# Patient Record
Sex: Male | Born: 1970 | Race: Black or African American | Hispanic: No | Marital: Married | Smoking: Never smoker
Health system: Southern US, Community
[De-identification: ages and names within clinical notes are randomized; demographics above are authoritative.]

## PROBLEM LIST (undated history)

## (undated) DIAGNOSIS — Z202 Contact with and (suspected) exposure to infections with a predominantly sexual mode of transmission: Secondary | ICD-10-CM

## (undated) DIAGNOSIS — E785 Hyperlipidemia, unspecified: Secondary | ICD-10-CM

## (undated) HISTORY — DX: Hyperlipidemia, unspecified: E78.5

## (undated) HISTORY — PX: WISDOM TOOTH EXTRACTION: SHX21

## (undated) HISTORY — PX: CYSTECTOMY: SUR359

## (undated) HISTORY — DX: Contact with and (suspected) exposure to infections with a predominantly sexual mode of transmission: Z20.2

---

## 1995-10-18 DIAGNOSIS — Z202 Contact with and (suspected) exposure to infections with a predominantly sexual mode of transmission: Secondary | ICD-10-CM

## 1995-10-18 HISTORY — DX: Contact with and (suspected) exposure to infections with a predominantly sexual mode of transmission: Z20.2

## 2003-07-29 ENCOUNTER — Ambulatory Visit (HOSPITAL_COMMUNITY): Admission: RE | Admit: 2003-07-29 | Discharge: 2003-07-29 | Payer: Self-pay

## 2005-03-31 ENCOUNTER — Encounter: Admission: RE | Admit: 2005-03-31 | Discharge: 2005-03-31 | Payer: Self-pay | Admitting: Internal Medicine
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
## 2011-08-16 ENCOUNTER — Encounter: Payer: Self-pay | Admitting: Gastroenterology

## 2011-09-01 ENCOUNTER — Encounter: Payer: Self-pay | Admitting: Gastroenterology

## 2011-09-01 ENCOUNTER — Ambulatory Visit (INDEPENDENT_AMBULATORY_CARE_PROVIDER_SITE_OTHER): Payer: BC Managed Care – PPO | Admitting: Gastroenterology

## 2011-09-01 VITALS — BP 154/96 | HR 80 | Ht 71.0 in | Wt 179.0 lb

## 2011-09-01 DIAGNOSIS — K644 Residual hemorrhoidal skin tags: Secondary | ICD-10-CM

## 2011-09-01 DIAGNOSIS — K921 Melena: Secondary | ICD-10-CM

## 2011-09-01 MED ORDER — PEG-KCL-NACL-NASULF-NA ASC-C 100 G PO SOLR: 1.0000 | Freq: Once | ORAL | Status: DC

## 2011-09-01 NOTE — Patient Instructions (Signed)
You have been scheduled for a Colonoscopy with propofol . See separate instructions.  Pick up your prep kit from your pharmacy.  cc: Richard Tisovec, MD 

## 2011-09-01 NOTE — Progress Notes (Signed)
History of Present Illness: This is a 40 year old male with a one-year history of intermittent bright red blood per rectum occurring with bowel movements. He has noted blood on the tissue paper and in the commode. Denies weight loss, abdominal pain, constipation, diarrhea, change in stool caliber, melena, nausea, vomiting, dysphagia, reflux symptoms, chest pain.  Past Medical History  Diagnosis Date  . 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 Hyperlipidemia    History reviewed. No pertinent past surgical history.  reports that he has never smoked. He has never used smokeless tobacco. He reports that he drinks alcohol. He reports that he does not use illicit drugs. family history includes Hyperlipidemia in his father and maternal grandmother; Hypertension in his maternal grandmother; Pancreatic cancer in his maternal aunt; and Prostate cancer in his paternal grandfather. No Known Allergies    Outpatient Encounter Prescriptions as of 09/01/2011  Medication Sig Dispense Refill  . atorvastatin (LIPITOR) 20 MG tablet Take 20 mg by mouth daily.        . peg 3350 powder (MOVIPREP) 100 G SOLR Take 1 kit (100 g total) by mouth once.  1 kit  0   Review of Systems: Pertinent positive and negative review of systems were noted in the above HPI section. All other review of systems were otherwise negative.   Physical Exam: General: Well developed , well nourished, no acute distress Head: Normocephalic and atraumatic Eyes:  sclerae anicteric, EOMI Ears: Normal auditory acuity Mouth: No deformity or lesions Neck: Supple, no masses or thyromegaly Lungs: Clear throughout to auscultation Heart: Regular rate and rhythm; no murmurs, rubs or bruits Abdomen: Soft, non tender and non distended. No masses, hepatosplenomegaly or hernias noted. Normal Bowel sounds Rectal: Moderate external hemorrhoids and tags, no internal lesions, Hemoccult-negative stool the vault Musculoskeletal: Symmetrical with no gross deformities  Skin: No  lesions on visible extremities Pulses:  Normal pulses noted Extremities: No clubbing, cyanosis, edema or deformities noted Neurological: Alert oriented x 4, grossly nonfocal Cervical Nodes:  No significant cervical adenopathy Inguinal Nodes: No significant inguinal adenopathy Psychological:  Alert and cooperative. Normal mood and affect  Assessment and Recommendations:  1. Hematochezia. I suspect the source is hemorrhoidal. External hemorrhoid on exam and he may have internal hemorrhoids as well. Rule out colorectal neoplasms, proctitis and other disorders. The risks, benefits, and alternatives to colonoscopy with possible biopsy, possible destruction of internal hemorrhoids and possible polypectomy were discussed with the patient and they consent to proceed.

## 2011-09-17 HISTORY — PX: COLONOSCOPY: SHX174

## 2011-09-28 ENCOUNTER — Encounter: Payer: BC Managed Care – PPO | Admitting: Gastroenterology

## 2011-10-05 ENCOUNTER — Encounter: Payer: Self-pay | Admitting: Gastroenterology

## 2011-10-05 ENCOUNTER — Ambulatory Visit (AMBULATORY_SURGERY_CENTER): Payer: BC Managed Care – PPO | Admitting: Gastroenterology

## 2011-10-05 DIAGNOSIS — K921 Melena: Secondary | ICD-10-CM

## 2011-10-05 DIAGNOSIS — K644 Residual hemorrhoidal skin tags: Secondary | ICD-10-CM

## 2011-10-05 DIAGNOSIS — K648 Other hemorrhoids: Secondary | ICD-10-CM

## 2011-10-05 MED ORDER — SODIUM CHLORIDE 0.9 % IV SOLN: 500.0000 mL | INTRAVENOUS | Status: DC

## 2011-10-05 NOTE — Patient Instructions (Signed)
DISCHARGE INSTRUCTIONS GIVEN WITH VERBAL UNDERSTANDING. HANDOUTS ON HEMORRHOIDS AND A HIGH FIBER DIET GIVEN. RESUME PREVIOUS MEDICATIONS.

## 2011-10-05 NOTE — Op Note (Signed)
[facility] [address] [place], [place]  [address]  COLONOSCOPY PROCEDURE REPORT  PATIENT:  Paul Wiley, Paul Wiley  MR#:  [id number] BIRTHDATE:  1971-05-23, 40 yrs. old  GENDER:  male ENDOSCOPIST:  Judie Petit T. Russella Dar, MD, [facility] Referred by:  Guerry Bruin, M.D. PROCEDURE DATE:  10/05/2011 PROCEDURE:  Colon w/ inj sclerosis of hemorrhoids ASA CLASS:  Class II INDICATIONS:  1) hematochezia MEDICATIONS:   MAC sedation, administered by CRNA, propofol (Diprivan) 200 mg IV DESCRIPTION OF PROCEDURE:   After the risks benefits and alternatives of the procedure were thoroughly explained, informed consent was obtained.  Digital rectal exam was performed and revealed moderate external hemorrhoids.   The LB CF-[id number] endoscope was introduced through the anus and advanced to the cecum, which was identified by both the appendix and ileocecal valve, without limitations.  The quality of the prep was excellent, using MoviPrep.  The instrument was then slowly withdrawn as the colon was fully examined. 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 It was non-bleeding and 4 mm in size.  Internal Hemorrhoids were found. They were moderately sized and erythematous. Injection sclerosis of internal hemorrhoids with 3 cc of 23.4% saline injected well above the dentate line. Otherwise normal colonoscopy without other polyps, masses, vascular ectasias, or inflammatory changes.  Retroflexed views in the rectum revealed no other findings other than those already described.  The time to cecum = 3.75  minutes. The scope was then withdrawn (time =  11.33  min) from the patient and the procedure completed.  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 2) Continue current colorectal  screening for "routine risk" patients with a repeat colonoscopy at age 24. 3) Surgical referral if hemorrhoids remain symptomatic  Lenisha Lacap T. Russella Dar, MD, [facility]  n. eSIGNED:   Venita Lick. Thadd Apuzzo at 10/05/2011 02:07 PM  Jeananne Rama, [phone]

## 2011-10-05 NOTE — Progress Notes (Signed)
Patient did not experience any of the following events: a burn prior to discharge; a fall within the facility; wrong site/side/patient/procedure/implant event; or a hospital transfer or hospital admission upon discharge from the facility. (G8907) Patient did not have preoperative order for IV antibiotic SSI prophylaxis. (G8918)  

## 2011-10-06 ENCOUNTER — Telehealth: Payer: Self-pay

## 2011-10-06 NOTE — Telephone Encounter (Signed)

## 2015-07-31 ENCOUNTER — Other Ambulatory Visit: Payer: Self-pay | Admitting: Surgery

## 2015-07-31 NOTE — H&P (Signed)
Paul Wiley 07/31/2015 9:32 AM Location: [facility] Patient #: [id number] DOB: Dec 16, 1970 Divorced / Language: Paul Wiley / Race: Black or African American Male History of Present Illness Adin Hector MD; 07/31/2015 10:14 AM) Patient words: hems.  The patient is a 44 year old male who presents with a complaint of rectal bleeding. Patient sent by his primary care physician Dr. Domenick Gong for concern of rectal bleeding. Most likely due to hemorrhoids.  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 Had colonoscopy in 2012 by Dr. Lucio Edward with [facility] gastroenterology. Underwhelming except for some internal hemorrhoids. Injected with hypertonic saline. He noted that only helped for a few weeks but then had bleeding again. He has a bowel movement every day. He is to happen easily in the morning but over the past few years he struggles and sometimes can take 45 minutes to have a bowel movement. Often can be watery or loose. Not taking a fiber supplement. He now has chronic drainage and often has to wear a pad as well. Significant bleeding occasionally. No fevers or chills. Walk several miles without difficulty. No prior abdominal surgery. No Crohn's. Also colitis. No family history of colon polyps or cancer. No sick contacts or travel history. No history of ulcers or heartburn or reflux. No cardiac disease. Other Problems Marjean Donna, [place]; 07/31/2015 9:32 AM) Hemorrhoids Hypercholesterolemia  Past Surgical History Marjean Donna, CMA; 07/31/2015 9:32 AM) No pertinent past surgical history  Diagnostic Studies History Marjean Donna, CMA; 07/31/2015 9:32 AM) Colonoscopy 1-5 years ago  Allergies Davy Pique Bynum, CMA; 07/31/2015 9:33 AM) No Known Drug Allergies 07/31/2015  Medication History (Sonya Bynum, CMA; 07/31/2015 9:33 AM) Atorvastatin Calcium (20MG  Tablet, Oral)  Active. Medications Reconciled  Social History Adin Hector, MD; 07/31/2015 10:14 AM) Alcohol use Occasional alcohol use. 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 Skin Not Present- Change in Wart/Mole, Dryness, Hives, Jaundice, New Lesions, Non-Healing Wounds, Rash and Ulcer. HEENT Present- Wears glasses/contact lenses. Not Present- Earache, Hearing Loss, Hoarseness, Nose Bleed, Oral Ulcers, Ringing in the Ears, Seasonal Allergies, Sinus Pain, Sore Throat, Visual Disturbances and Yellow Eyes. Respiratory Not Present- Bloody sputum, Chronic Cough, Difficulty Breathing, Snoring and Wheezing. Breast Not Present- Breast Mass, Breast Pain, Nipple Discharge and Skin Changes. Cardiovascular Not Present- Chest Pain, Difficulty Breathing Lying Down, Leg Cramps, Palpitations, Rapid Heart Rate, Shortness of Breath and Swelling of Extremities. Gastrointestinal Present- Bloody Stool, Change in Bowel Habits, Hemorrhoids and Rectal Pain. Not Present- Abdominal Pain, Bloating, Chronic diarrhea, Constipation, Difficulty Swallowing, Excessive gas, Gets full quickly at meals, Indigestion, Nausea and Vomiting. Male Genitourinary Not Present- Blood in Urine, Change in Urinary Stream, Frequency, Impotence, Nocturia, Painful Urination, Urgency and Urine Leakage. Musculoskeletal Not Present- Back Pain, Joint Pain, Joint Stiffness, Muscle Pain, Muscle Weakness and Swelling of Extremities. Neurological Not Present- Decreased Memory, Fainting, Headaches, Numbness, Seizures, Tingling, Tremor, Trouble walking and Weakness. Psychiatric Not Present- Anxiety, Bipolar, Change in Sleep Pattern, Depression, Fearful and Frequent  crying. Endocrine Not Present- Cold Intolerance, Excessive Hunger, Hair Changes, Heat Intolerance, Hot flashes and New Diabetes. Hematology Not Present- Easy Bruising, Excessive bleeding, Gland problems, HIV and Persistent Infections.  Vitals Davy Pique Bynum CMA; 07/31/2015  9:33 AM) 07/31/2015 9:32 AM Weight: 179 lb Height: 71in Body Surface Area: 2.02 m Body Mass Index: 24.97 kg/m Temp.: 98.23F(Temporal)  Pulse: 79 (Regular)  BP: 124/78 (Sitting, Left Arm, Standard)     Physical Exam Adin Hector MD; 07/31/2015 10:09 AM)  General Mental Status-Alert. General Appearance-Not in acute distress, Not Sickly. Orientation-Oriented X3. Hydration-Well hydrated. Voice-Normal.  Integumentary Global Assessment Upon inspection and palpation of skin surfaces of the - Axillae: non-tender, no inflammation or ulceration, no drainage. and Distribution of scalp and body hair is normal. General Characteristics Temperature - normal warmth is noted.  Head and Neck Head-normocephalic, atraumatic with no lesions or palpable masses. Face Global Assessment - atraumatic, no absence of expression. Neck Global Assessment - no abnormal movements, no bruit auscultated on the right, no bruit auscultated on the left, no decreased range of motion, non-tender. Trachea-midline. Thyroid Gland Characteristics - non-tender.  Eye Eyeball - Left-Extraocular movements intact, No Nystagmus. Eyeball - Right-Extraocular movements intact, No Nystagmus. Cornea - Left-No Hazy. Cornea - Right-No Hazy. Sclera/Conjunctiva - Left-No scleral icterus, No Discharge. Sclera/Conjunctiva - Right-No scleral icterus, No Discharge. Pupil - Left-Direct reaction to light normal. Pupil - Right-Direct reaction to light normal.  ENMT Ears Pinna - Left - no drainage observed, no generalized tenderness observed. Right - no drainage observed, no generalized tenderness observed. Nose and  Sinuses External Inspection of the Nose - no destructive lesion observed. Inspection of the nares - Left - quiet respiration. Right - quiet respiration. Mouth and Throat Lips - Upper Lip - no fissures observed, no pallor noted. Lower Lip - no fissures observed, no pallor noted. Nasopharynx - no discharge present. Oral Cavity/Oropharynx - Tongue - no dryness observed. Oral Mucosa - no cyanosis observed. Hypopharynx - no evidence of airway distress observed.  Chest and Lung Exam Inspection Movements - Normal and Symmetrical. Accessory muscles - No use of accessory muscles in breathing. Palpation Palpation of the chest reveals - Non-tender. Auscultation Breath sounds - Normal and Clear.  Cardiovascular Auscultation Rhythm - Regular. Murmurs & Other Heart Sounds - Auscultation of the heart reveals - No Murmurs and No Systolic Clicks.  Abdomen Inspection Inspection of the abdomen reveals - No Visible peristalsis and No Abnormal pulsations. Umbilicus - No Bleeding, No Urine drainage. Palpation/Percussion Palpation and Percussion of the abdomen reveal - Soft, Non Tender, No Rebound tenderness, No Rigidity (guarding) and No Cutaneous hyperesthesia.  Male Genitourinary Sexual Maturity Tanner 5 - Adult hair pattern and Adult penile size and shape. Note: Normal external male genitalia. No inguinal hernias.   Rectal Note: Exam done with assistance of male Medical Assistant in the room. Perianal skin clean with good hygiene. No pruritis ani. No pilonidal disease. No fissure. No abscess/fistula. No warts/condyloma  Normal sphincter tone. Tolerates digital and anoscopic rectal exam. No rectal masses. Prostate smooth.  Hemorrhoidal piles: Large chronically prolapsed LEFT lateral internal hemorrhoid with external component. Inflamed RIGHT posterior internal hemorrhoid that easily prolapses. Grade 2 RIGHT anterior hemorrhoid as well. Rather friable.   Peripheral Vascular Upper  Extremity Inspection - Left - No Cyanotic nailbeds, Not Ischemic. Right - No Cyanotic nailbeds, Not Ischemic.  Neurologic Neurologic evaluation reveals -normal attention span and ability to concentrate, able to name objects and repeat phrases. Appropriate fund of knowledge , normal sensation and normal coordination. Mental Status Affect - not angry, not paranoid. Cranial Nerves-Normal Bilaterally. Gait-Normal.  Neuropsychiatric Mental status exam performed with findings of-able to articulate well with normal speech/language, rate, volume and coherence, thought content normal with  ability to perform basic computations and apply abstract reasoning and no evidence of hallucinations, delusions, obsessions or homicidal/suicidal ideation.  Musculoskeletal Global Assessment Spine, Ribs and Pelvis - no instability, subluxation or laxity. Right Upper Extremity - no instability, subluxation or laxity.  Lymphatic Head & Neck  General Head & Neck Lymphatics: Bilateral - Description - No Localized lymphadenopathy. Axillary  General Axillary Region: Bilateral - Description - No Localized lymphadenopathy. Femoral & Inguinal  Generalized Femoral & Inguinal Lymphatics: Left - Description - No Localized lymphadenopathy. Right - Description - No Localized lymphadenopathy.   Results Adin Hector MD; 07/31/2015 10:14 AM) Procedures  Name Value Date Hemorrhoids Procedure Anal exam: External Hemorrhoid Internal exam: Internal Hemorrhoids (bleeding) Internal Hemorroids ( non-bleeding) prolapse Other: Please see rectal examination section. LEFT lateral chronically prolapsed internal/external hemorrhoid. Friable easily bleeds = Grade 4. RIGHT posterior inflamed partially prolapsed hemorrhoid as well. Grade 3. RIGHT anterior grade 2 hemorrhoid.  Performed: 07/31/2015 10:11 AM    Assessment & Plan Adin Hector MD; 07/31/2015 10:14 AM)  PROLAPSED INTERNAL  HEMORRHOIDS, GRADE 4 (K64.3) Impression: Significant prolapsed internal/external hemorrhoid with significant rectal bleeding. Struggling despite prior injection and daily bowel movement only.  I strongly recommended a fiber bowel regimen to help his defecation go more easily.  I think this requires surgery to resolve since he is chronically prolapsed. I am skeptical that repeat injections or banding will succeed. I would do THD hemorrhoidal ligation and pexy with excision of any remaining tissue. He agrees.  He was hoping to get back to work rather quickly. I cautioned him against this and noted it can take at least 2 weeks to recover before thinking about going back to surgery. He was hoping a week. I am skeptical that that is realistic. I think ultimately one surgery but is contemplating when he can get off work. He will think about things and let us know.  Current Plans ANOSCOPY, DIAGNOSTIC (67209) Pt Education - [facility] Hemorrhoids (Kortnee Bas): discussed with patient and provided information. Pt Education - Pamphlet Given - The Hemorrhoid Book: discussed with patient and provided information. You are being scheduled for surgery - Our schedulers will call you.  You should hear from our office's scheduling department within 5 working days about the location, date, and time of surgery. We try to make accommodations for patient's preferences in scheduling surgery, but sometimes the OR schedule or the surgeon's schedule prevents [place] from making those accommodations.  If you have not heard from our office [phone]) in 5 working days, call the office and ask for your surgeon's nurse.  If you have other questions about your diagnosis, plan, or surgery, call the office and ask for your surgeon's nurse.   The anatomy & physiology of the anorectal region was discussed. The pathophysiology of hemorrhoids and differential diagnosis was discussed. Natural history risks without surgery was discussed. I  stressed the importance of a bowel regimen to have daily soft bowel movements to minimize progression of disease. Interventions such as sclerotherapy & banding were discussed.  The patient's symptoms are not adequately controlled by medicines and other non-operative treatments. I feel the risks & problems of no surgery outweigh the operative risks; therefore, I recommended surgery to treat the hemorrhoids by ligation, pexy, and possible resection.  Risks such as bleeding, infection, urinary difficulties, need for further treatment, heart attack, death, and other risks were discussed. I noted a good likelihood this will help address the problem. Goals of post-operative recovery were discussed as well. Possibility that  this will not correct all symptoms was explained. Post-operative pain, bleeding, constipation, and other problems after surgery were discussed. We will work to minimize complications. Educational handouts further explaining the pathology, treatment options, and bowel regimen were given as well. Questions were answered. The patient expresses understanding & wishes to proceed with surgery. Pt Education - [facility] Rectal Prep for Anorectal outpatient/office surgery: discussed with patient and provided information. PROLAPSED INTERNAL HEMORRHOIDS, GRADE 3 (K64.2) Impression: Will require hemorrhoidal ligation and excision.  Current Plans Pt Education - [facility] Good Bowel Health (Ndrew Creason) Pt Education - [facility] Pelvic Floor Exercises (Kegels) and Dysfunction HCI (Kolt Mcwhirter)  Adin Hector, M.D., F.A.C.S. Gastrointestinal and Minimally Invasive Surgery [facility] [address] [place], [place] [address] [phone] Main / Paging

## 2021-02-18 ENCOUNTER — Other Ambulatory Visit: Payer: Self-pay

## 2021-02-18 ENCOUNTER — Emergency Department
Admission: EM | Admit: 2021-02-18 | Discharge: 2021-02-18 | Disposition: A | Discharge status: Home / Self Care | Payer: Managed Care, Other (non HMO) | Attending: Emergency Medicine | Admitting: Emergency Medicine

## 2021-02-18 ENCOUNTER — Emergency Department: Payer: Managed Care, Other (non HMO)

## 2021-02-18 DIAGNOSIS — N132 Hydronephrosis with renal and ureteral calculous obstruction: Secondary | ICD-10-CM | POA: Diagnosis not present

## 2021-02-18 DIAGNOSIS — N201 Calculus of ureter: Secondary | ICD-10-CM

## 2021-02-18 DIAGNOSIS — R1032 Left lower quadrant pain: Secondary | ICD-10-CM | POA: Diagnosis present

## 2021-02-18 DIAGNOSIS — D72829 Elevated white blood cell count, unspecified: Secondary | ICD-10-CM | POA: Diagnosis not present

## 2021-02-18 DIAGNOSIS — N200 Calculus of kidney: Secondary | ICD-10-CM

## 2021-02-18 LAB — URINALYSIS, COMPLETE (UACMP) WITH MICROSCOPIC
Bacteria, UA: NONE SEEN
Bilirubin Urine: NEGATIVE
Glucose, UA: NEGATIVE mg/dL
Hgb urine dipstick: NEGATIVE
Ketones, ur: 20 mg/dL — AB
Leukocytes,Ua: NEGATIVE
Nitrite: NEGATIVE
Protein, ur: NEGATIVE mg/dL
Specific Gravity, Urine: 1.018 (ref 1.005–1.030)
Squamous Epithelial / HPF: NONE SEEN (ref 0–5)
pH: 7 (ref 5.0–8.0)

## 2021-02-18 LAB — COMPREHENSIVE METABOLIC PANEL
ALT: 19 U/L (ref 0–44)
AST: 24 U/L (ref 15–41)
Albumin: 4.2 g/dL (ref 3.5–5.0)
Alkaline Phosphatase: 39 U/L (ref 38–126)
Anion gap: 13 (ref 5–15)
BUN: 14 mg/dL (ref 6–20)
CO2: 21 mmol/L — ABNORMAL LOW (ref 22–32)
Calcium: 9.1 mg/dL (ref 8.9–10.3)
Chloride: 105 mmol/L (ref 98–111)
Creatinine, Ser: 1.01 mg/dL (ref 0.61–1.24)
GFR, Estimated: >60 mL/min (ref >60)
Glucose, Bld: 133 mg/dL — ABNORMAL HIGH (ref 70–99)
Potassium: 3.8 mmol/L (ref 3.5–5.1)
Sodium: 139 mmol/L (ref 135–145)
Total Bilirubin: 0.9 mg/dL (ref 0.3–1.2)
Total Protein: 7.6 g/dL (ref 6.5–8.1)

## 2021-02-18 LAB — CBC
HCT: 40.6 % (ref 39.0–52.0)
Hemoglobin: 13.4 g/dL (ref 13.0–17.0)
MCH: 29.6 pg (ref 26.0–34.0)
MCHC: 33 g/dL (ref 30.0–36.0)
MCV: 89.6 fL (ref 80.0–100.0)
Platelets: 364 10*3/uL (ref 150–400)
RBC: 4.53 MIL/uL (ref 4.22–5.81)
RDW: 12.2 % (ref 11.5–15.5)
WBC: 23.2 10*3/uL — ABNORMAL HIGH (ref 4.0–10.5)
nRBC: 0 % (ref 0.0–0.2)

## 2021-02-18 LAB — LIPASE, BLOOD: Lipase: 27 U/L (ref 11–51)

## 2021-02-18 IMAGING — CT CT ABD-PELV W/ CM
2 of 5 series · 15 of 46 positions shown, 17 images · IV contrast (omnipaque)
Comparison: None.

CLINICAL DATA: Left-sided abdominal pain.

EXAM:
CT ABDOMEN AND PELVIS WITH CONTRAST
TECHNIQUE: Multidetector CT imaging of the abdomen and pelvis was performed
using the standard protocol following bolus administration of
intravenous contrast.
CONTRAST:  100mL OMNIPAQUE IOHEXOL 300 MG/ML  SOLN

[Series 2: routine abd/pel with · axial · 0.79mm/px · z∈[-1090,-610]mm · 12 of 110 slices shown, 14 images]
[im 7/110  soft-tissue]
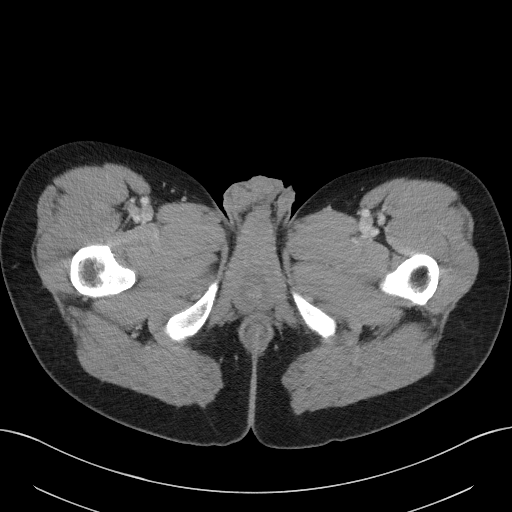
[im 7/110  bone]
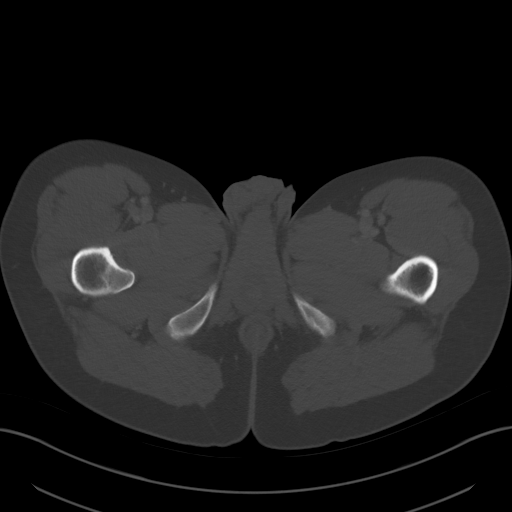
[im 19/110  soft-tissue]
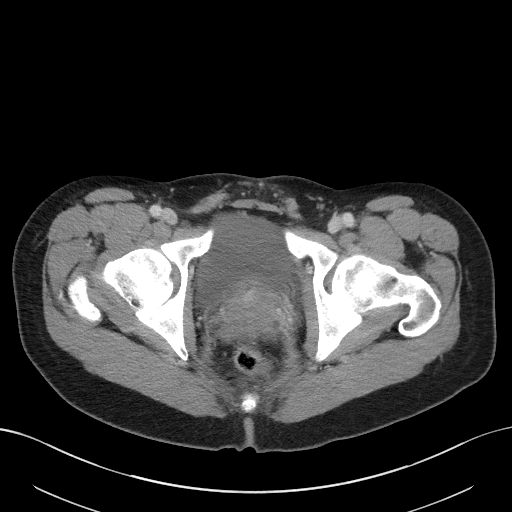
[im 25/110  soft-tissue]
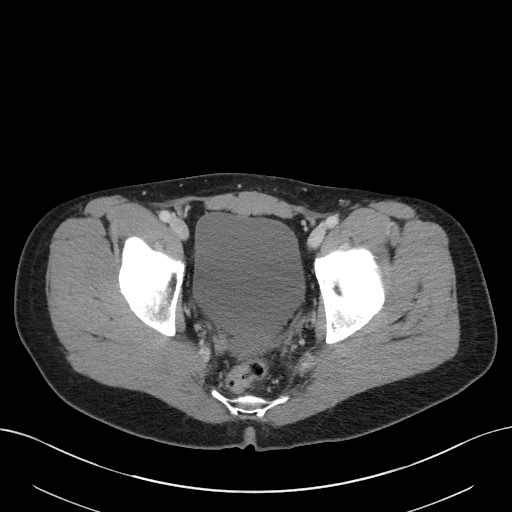
[im 31/110  soft-tissue]
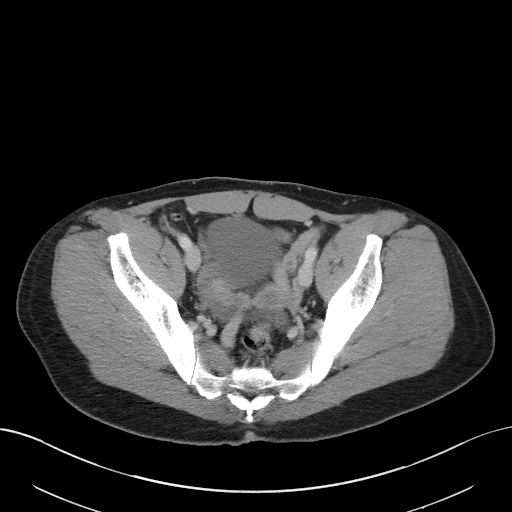
[im 43/110  soft-tissue]
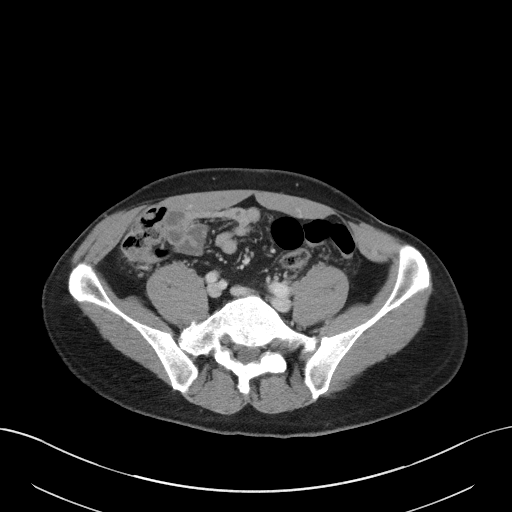
[im 49/110  soft-tissue]
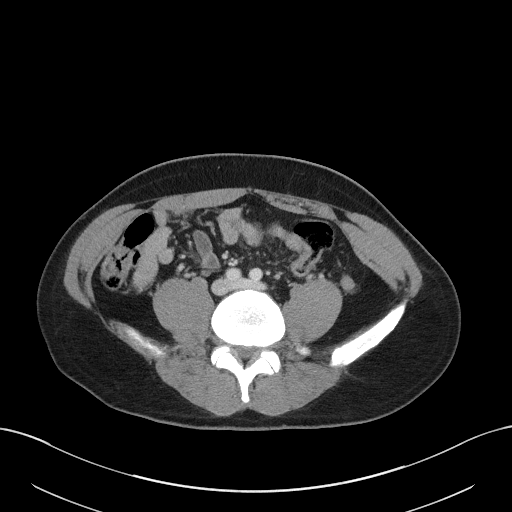
[im 61/110  soft-tissue]
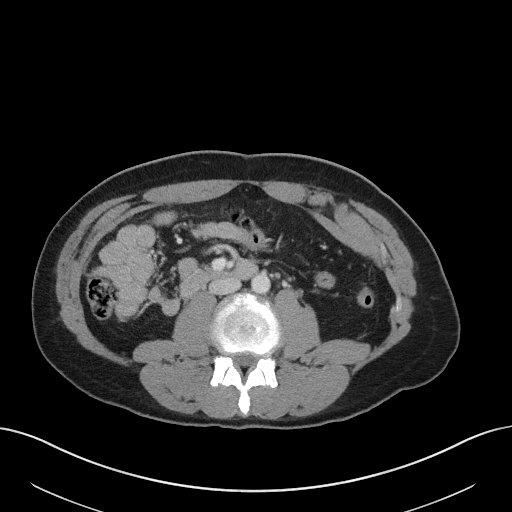
[im 67/110  soft-tissue]
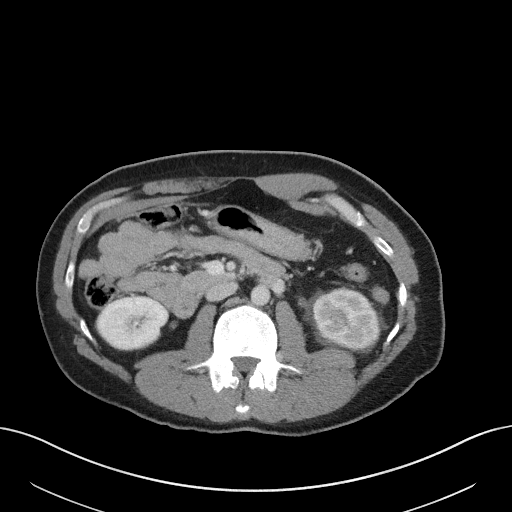
[im 79/110  soft-tissue]
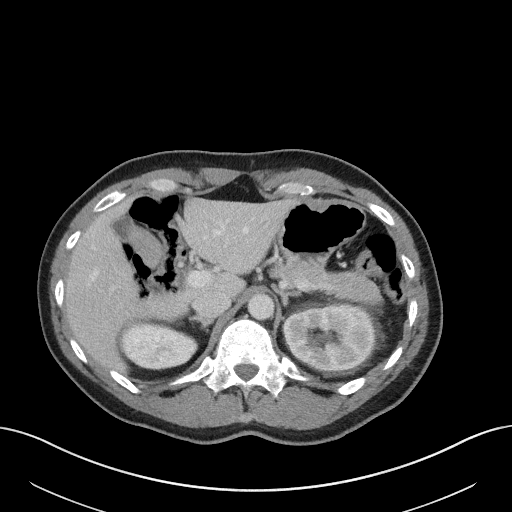
[im 79/110  bone]
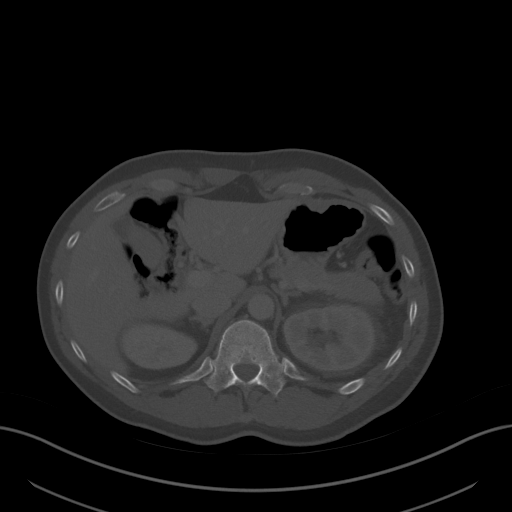
[im 85/110  soft-tissue]
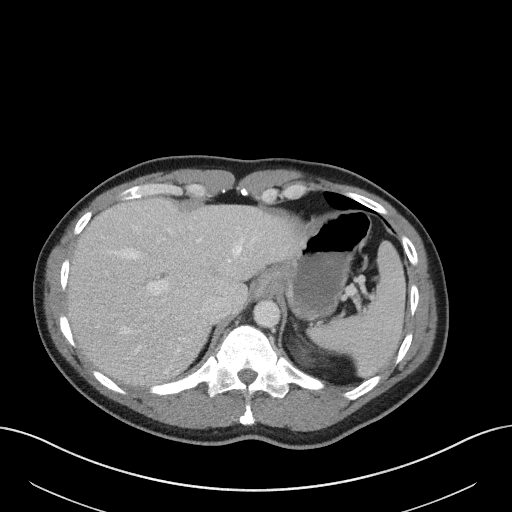
[im 91/110  soft-tissue]
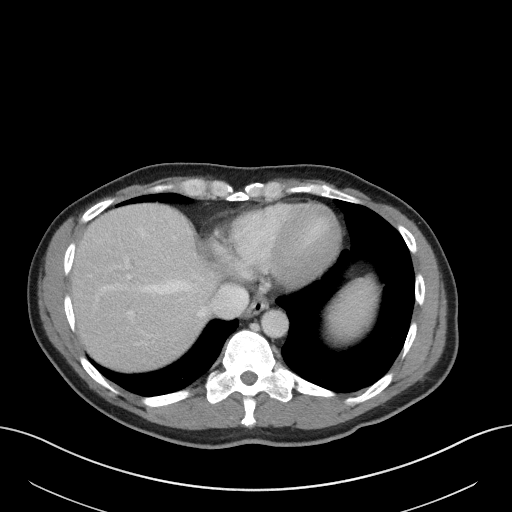
[im 103/110  soft-tissue]
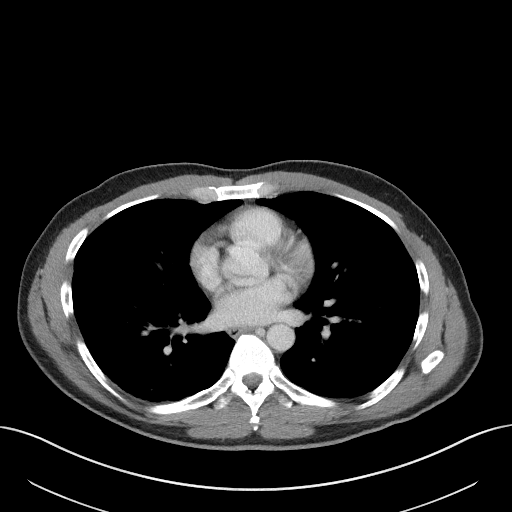

[Series 5: coronal st · coronal · 0.83mm/px · 3 of 82 slices shown]
[im 28/82  soft-tissue]
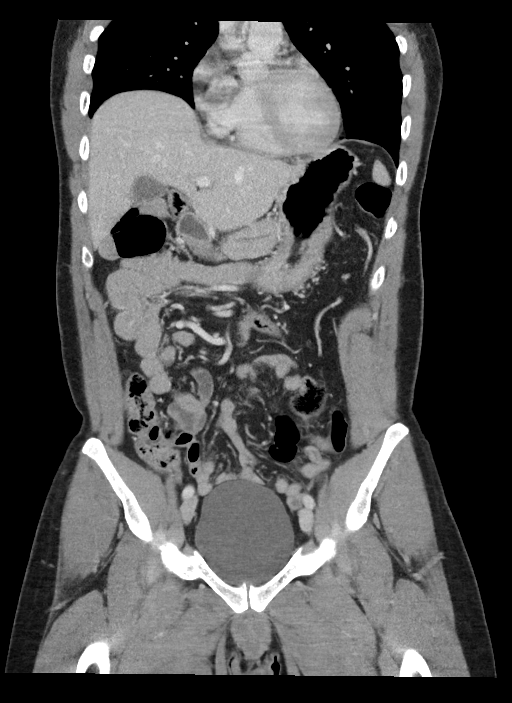
[im 37/82  soft-tissue]
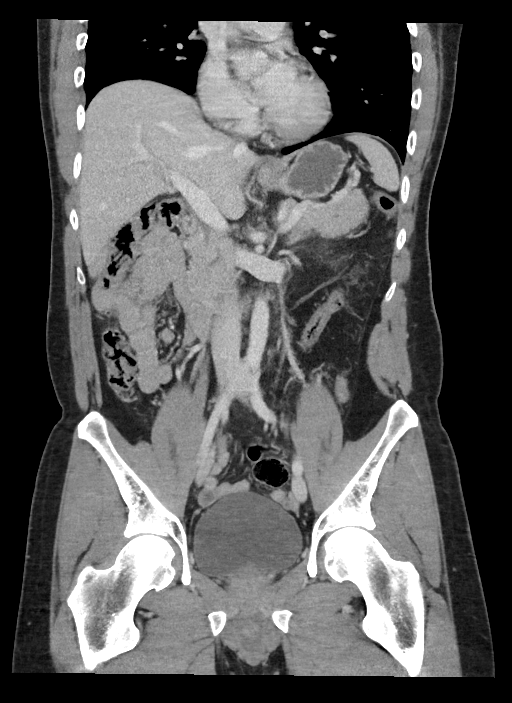
[im 46/82  soft-tissue]
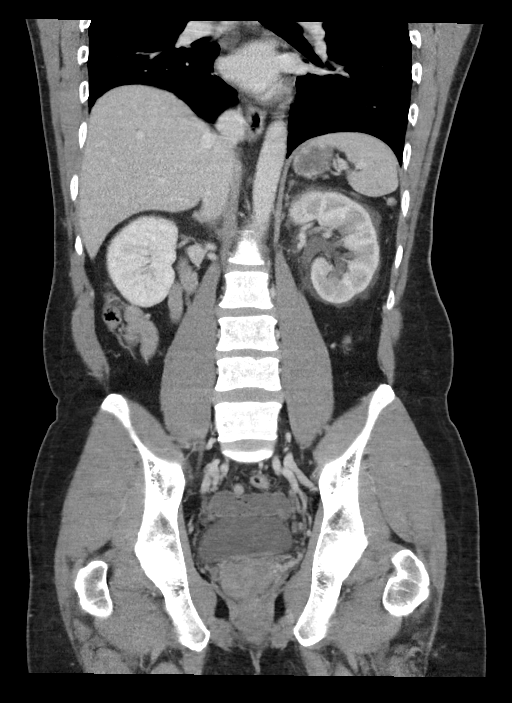

[15 of 46 positions shown; findings below may reference images not displayed]

FINDINGS: Lower chest: Patchy area of ground-glass attenuation noted in the
left lower lobe, likely postinflammatory, image [DATE].

Hepatobiliary: No focal liver abnormality is seen. No gallstones,
gallbladder wall thickening, or biliary dilatation.

Pancreas: Unremarkable. No pancreatic ductal dilatation or
surrounding inflammatory changes.

Spleen: Normal in size without focal abnormality.

Adrenals/Urinary Tract: Normal adrenal glands. The right kidney is
unremarkable. There is asymmetric left-sided nephromegaly and
diffuse perinephric fat stranding. Mild left hydronephrosis and
hydroureter noted. A stone within the proximal left ureter is
identified measuring 4 mm, image 41/5. Urinary bladder unremarkable.

Stomach/Bowel: The stomach appears normal. The appendix is
visualized and is within normal limits. Although the third portion
of the duodenum crosses the midline the proximal jejunum as well as
the remaining small bowel loops are largely contained in the right
hemiabdomen which may reflect malrotation variant. The cecum however
is in the normal position within the right lower quadrant of the
abdomen. No signs of bowel wall thickening, inflammation or
distension.

Vascular/Lymphatic: No significant vascular findings are present. No
enlarged abdominal or pelvic lymph nodes.

Reproductive: Prostate is unremarkable.

Other: No free fluid or fluid collections.

Musculoskeletal: No acute or significant osseous findings.
IMPRESSION: 1. Left-sided hydronephrosis and hydroureter secondary to 4 mm
proximal left ureteral calculus.
2. Patchy area of ground-glass attenuation in the left lower lobe,
likely postinflammatory.

## 2021-02-18 MED ORDER — OXYCODONE-ACETAMINOPHEN 5-325 MG PO TABS: 1.0000 | ORAL_TABLET | ORAL | 0 refills | Status: AC | PRN

## 2021-02-18 MED ORDER — MORPHINE SULFATE (PF) 4 MG/ML IV SOLN
4.0000 mg | Freq: Once | INTRAVENOUS | Status: AC
  Administered 2021-02-18: 4 mg via INTRAVENOUS
  Filled 2021-02-18: qty 1

## 2021-02-18 MED ORDER — LACTATED RINGERS IV BOLUS
1000.0000 mL | Freq: Once | INTRAVENOUS | Status: AC
  Administered 2021-02-18: 1000 mL via INTRAVENOUS

## 2021-02-18 MED ORDER — ONDANSETRON 4 MG PO TBDP: 4.0000 mg | ORAL_TABLET | Freq: Three times a day (TID) | ORAL | 0 refills | Status: DC | PRN

## 2021-02-18 MED ORDER — KETOROLAC TROMETHAMINE 30 MG/ML IJ SOLN
15.0000 mg | Freq: Once | INTRAMUSCULAR | Status: AC
  Administered 2021-02-18: 15 mg via INTRAVENOUS
  Filled 2021-02-18: qty 1

## 2021-02-18 MED ORDER — OXYCODONE-ACETAMINOPHEN 5-325 MG PO TABS: 1.0000 | ORAL_TABLET | ORAL | 0 refills | Status: DC | PRN

## 2021-02-18 MED ORDER — ONDANSETRON HCL 4 MG/2ML IJ SOLN
4.0000 mg | Freq: Once | INTRAMUSCULAR | Status: AC
  Administered 2021-02-18: 4 mg via INTRAVENOUS
  Filled 2021-02-18: qty 2

## 2021-02-18 MED ORDER — IOHEXOL 300 MG/ML  SOLN
100.0000 mL | Freq: Once | INTRAMUSCULAR | Status: AC | PRN
  Administered 2021-02-18: 100 mL via INTRAVENOUS

## 2021-02-18 MED ORDER — HYDROMORPHONE HCL 1 MG/ML IJ SOLN
0.5000 mg | Freq: Once | INTRAMUSCULAR | Status: AC
Start: 2021-02-18 — End: 2021-02-18
  Administered 2021-02-18: 0.5 mg via INTRAVENOUS
  Filled 2021-02-18: qty 1

## 2021-02-18 NOTE — ED Notes (Signed)
Lav top will need too be obtained, patient became exteremly rude during lab draw because writer had to re-stick patient due to him not sitting still in wheel chair

## 2021-02-18 NOTE — ED Notes (Signed)
Correct pharmacy sent medications at this time by Dr. Presley Raddle. Patient made aware. Patient taking to POV via wheelchair.

## 2021-02-18 NOTE — ED Notes (Signed)
Patient reports incorrect pharmacy for prescribed medications. Dr. Presley Raddle notified.

## 2021-02-18 NOTE — ED Provider Notes (Signed)
[facility] Emergency Department Provider Note   ____________________________________________   Event Date/Time   First MD Initiated Contact with Patient 02/18/21 1245     (approximate)  I have reviewed the triage vital signs and the nursing notes.   HISTORY  Chief Complaint Abdominal Pain    HPI Paul Wiley is a 50 y.o. male with possible history of hyperlipidemia who presents to the ED complaining of abdominal pain.  Patient reports sudden onset left lower quadrant and left flank pain around 1030 this morning.  Pain has been constant since then and gradually worsening, he describes it as sharp and not exacerbated or alleviated by anything.  He has felt nauseous but has not vomited and he denies any changes in his bowel movements.  He has not had any dysuria or hematuria, denies recent fevers.  He does not have any history of kidney stones and denies prior abdominal surgeries.        Past Medical History:  Diagnosis Date  . 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 Hyperlipidemia     Patient Active Problem List   Diagnosis Date Noted  . External hemorrhoids without mention of complication 27/03/2375    Past Surgical History:  Procedure Laterality Date  . CYSTECTOMY    . WISDOM TOOTH EXTRACTION      Prior to Admission medications   Medication Sig Start Date End Date Taking? Authorizing Provider  ondansetron (ZOFRAN ODT) 4 MG disintegrating tablet Take 1 tablet (4 mg total) by mouth every 8 (eight) hours as needed for nausea or vomiting. 02/18/21  Yes Blake Divine, MD  oxyCODONE-acetaminophen (PERCOCET) 5-325 MG tablet Take 1 tablet by mouth every 4 (four) hours as needed for severe pain. 02/18/21 02/18/22 Yes Blake Divine, MD  atorvastatin (LIPITOR) 20 MG tablet Take 20 mg by mouth daily.      [provider]  peg 3350 powder (MOVIPREP) 100 G SOLR Take 1 kit (100 g total) by mouth once. 09/01/11   Ladene Artist, MD    Allergies Patient has no  known allergies.  Family History  Problem Relation Age of Onset  . Hyperlipidemia Father   . Hyperlipidemia Maternal Grandmother   . Hypertension Maternal Grandmother   . Prostate cancer Paternal Grandfather   . Pancreatic cancer Maternal Aunt     Social History Social History   Tobacco Use  . Smoking status: Never Smoker  . Smokeless tobacco: Never Used  Substance Use Topics  . Alcohol use: Yes    Comment: occ beer  . Drug use: No    Review of Systems  Constitutional: No fever/chills Eyes: No visual changes. ENT: No sore throat. Cardiovascular: Denies chest pain. Respiratory: Denies shortness of breath. Gastrointestinal: Positive for abdominal pain and nausea, no vomiting.  No diarrhea.  No constipation. Genitourinary: Negative for dysuria. Musculoskeletal: Negative for back pain. Skin: Negative for rash. Neurological: Negative for headaches, focal weakness or numbness.  ____________________________________________   PHYSICAL EXAM:  VITAL SIGNS: ED Triage Vitals [02/18/21 1124]  Enc Vitals Group     BP (!) 145/85     Pulse Rate 71     Resp 16     Temp 99.1 F (37.3 C)     Temp Source Oral     SpO2 97 %     Weight 175 lb (79.4 kg)     Height _0  (1.803 m)     Head Circumference      Peak Flow      Pain Score  10     Pain Loc      Pain Edu?      Excl. in [place]?     Constitutional: Alert and oriented. Eyes: Conjunctivae are normal. Head: Atraumatic. Nose: No congestion/rhinnorhea. Mouth/Throat: Mucous membranes are moist. Neck: Normal ROM Cardiovascular: Normal rate, regular rhythm. Grossly normal heart sounds. Respiratory: Normal respiratory effort.  No retractions. Lungs CTAB. Gastrointestinal: Soft and tender to palpation in the left lower quadrant and left flank with voluntary guarding. No distention. Genitourinary: deferred Musculoskeletal: No lower extremity tenderness nor edema. Neurologic:  Normal speech and language. No gross focal  neurologic deficits are appreciated. Skin:  Skin is warm, dry and intact. No rash noted. Psychiatric: Mood and affect are normal. Speech and behavior are normal.  ____________________________________________   LABS (all labs ordered are listed, but only abnormal results are displayed)  Labs Reviewed  COMPREHENSIVE METABOLIC PANEL - Abnormal; Notable for the following components:      Result Value   CO2 21 (*)    Glucose, Bld 133 (*)    All other components within normal limits  URINALYSIS, COMPLETE (UACMP) WITH MICROSCOPIC - Abnormal; Notable for the following components:   Color, Urine YELLOW (*)    APPearance HAZY (*)    Ketones, ur 20 (*)    All other components within normal limits  CBC - Abnormal; Notable for the following components:   WBC 23.2 (*)    All other components within normal limits  LIPASE, BLOOD    PROCEDURES  Procedure(s) performed (including Critical Care):  Procedures   ____________________________________________   INITIAL IMPRESSION / ASSESSMENT AND PLAN / ED COURSE       50 year old male with past medical history of hyperlipidemia presents to the ED with acute onset left lower quadrant and left flank pain starting this morning.  Patient has tenderness both in his left lower quadrant and left flank area, we will further assess for pyelonephritis, diverticulitis, and nephrolithiasis with CT scan.  UA is pending, labs thus far are reassuring.  We will hydrate with IV fluids, treat symptomatically with IV morphine and Zofran.  CT scan shows obstructing left ureterolithiasis with associated hydronephrosis.  Labs significant for leukocytosis but otherwise reassuring, no evidence of infection on UA.  Patient initially with minimal improvement in pain despite morphine and Dilaudid, was given a dose of IV Toradol with significant improvement in pain.  He is appropriate for discharge home with urology follow-up, will be prescribed Percocet and Zofran.  He was  counseled to return to the ED for new worsening symptoms, patient agrees with plan.      ____________________________________________   FINAL CLINICAL IMPRESSION(S) / ED DIAGNOSES  Final diagnoses:  Ureterolithiasis  Kidney stone     ED Discharge Orders         Ordered    oxyCODONE-acetaminophen (PERCOCET) 5-325 MG tablet  Every 4 hours PRN        02/18/21 1547    ondansetron (ZOFRAN ODT) 4 MG disintegrating tablet  Every 8 hours PRN        02/18/21 1547           Note:  This document was prepared using Dragon voice recognition software and may include unintentional dictation errors.   Blake Divine, MD 02/18/21 [phone]

## 2021-02-18 NOTE — ED Notes (Signed)
Patient transported to CT 

## 2021-02-18 NOTE — ED Triage Notes (Signed)
Pt to ER via POV with complaints of sudden L sided abdominal pain, non-radiating. Denies hx of kidney stones, denies injury. No urinary symptoms or diarrhea.

## 2021-03-02 ENCOUNTER — Ambulatory Visit: Payer: Self-pay | Admitting: Urology

## 2021-03-02 NOTE — Progress Notes (Deleted)
03/02/2021 1:12 PM   Mady Gemma [birth year] [id number]  Referring provider: Haywood Pao, MD [address] [place],  [place] [address]  No chief complaint on file.   HPI:    PMH: Past Medical History:  Diagnosis Date  . 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 Hyperlipidemia     Surgical History: Past Surgical History:  Procedure Laterality Date  . CYSTECTOMY    . WISDOM TOOTH EXTRACTION      Home Medications:  Allergies as of 03/02/2021   No Known Allergies     Medication List       Accurate as of Mar 02, 2021  1:12 PM. If you have any questions, ask your nurse or doctor.        atorvastatin 20 MG tablet Commonly known as: LIPITOR Take 20 mg by mouth daily.   ondansetron 4 MG disintegrating tablet Commonly known as: Zofran ODT Take 1 tablet (4 mg total) by mouth every 8 (eight) hours as needed for nausea or vomiting.   oxyCODONE-acetaminophen 5-325 MG tablet Commonly known as: Percocet Take 1 tablet by mouth every 4 (four) hours as needed for severe pain.   peg 3350 powder 100 g Solr Commonly known as: MoviPrep Take 1 kit (100 g total) by mouth once.       Allergies: No Known Allergies  Family History: Family History  Problem Relation Age of Onset  . Hyperlipidemia Father   . Hyperlipidemia Maternal Grandmother   . Hypertension Maternal Grandmother   . Prostate cancer Paternal Grandfather   . Pancreatic cancer Maternal Aunt     Social History:  reports that he has never smoked. He has never used smokeless tobacco. He reports current alcohol use. He reports that he does not use drugs.   Physical Exam: There were no vitals taken for this visit.  Constitutional:  Alert and oriented, No acute distress. HEENT:  AT, moist mucus membranes.  Trachea midline, no masses. Cardiovascular: No clubbing, cyanosis, or edema. Respiratory: Normal respiratory effort, no increased work of breathing. GI: Abdomen is soft, nontender, nondistended, no abdominal  masses GU: No CVA tenderness Lymph: No cervical or inguinal lymphadenopathy. Skin: No rashes, bruises or suspicious lesions. Neurologic: Grossly intact, no focal deficits, moving all 4 extremities. Psychiatric: Normal mood and affect.  Laboratory Data: Lab Results  Component Value Date   WBC 23.2 (H) 02/18/2021   HGB 13.4 02/18/2021   HCT 40.6 02/18/2021   MCV 89.6 02/18/2021   PLT 364 02/18/2021    Lab Results  Component Value Date   CREATININE 1.01 02/18/2021    No results found for: PSA  No results found for: TESTOSTERONE  No results found for: HGBA1C  Urinalysis    Component Value Date/Time   COLORURINE YELLOW (A) 02/18/2021 1236   APPEARANCEUR HAZY (A) 02/18/2021 1236   LABSPEC 1.018 02/18/2021 1236   PHURINE 7.0 02/18/2021 [place] 02/18/2021 [place] 02/18/2021 [place] 02/18/2021 1236   KETONESUR 20 (A) 02/18/2021 [place] 02/18/2021 1236   NITRITE NEGATIVE 02/18/2021 [place] 02/18/2021 1236    Lab Results  Component Value Date   BACTERIA NONE SEEN 02/18/2021    Pertinent Imaging: *** No results found for this or any previous visit.  No results found for this or any previous visit.  No results found for this or any previous visit.  No results found for this or any previous visit.  No results  found for this or any previous visit.  No results found for this or any previous visit.  No results found for this or any previous visit.  No results found for this or any previous visit.   Assessment & Plan:    There are no diagnoses linked to this encounter.  No follow-ups on file.  Hollice Espy, MD  [facility] [address], [place], [place] [address] [phone]

## 2021-03-05 ENCOUNTER — Encounter: Payer: Self-pay | Admitting: Urology

## 2021-03-11 ENCOUNTER — Ambulatory Visit: Admit: 2021-03-11 | Payer: Managed Care, Other (non HMO) | Admitting: Urology

## 2021-03-11 SURGERY — LITHOTRIPSY, ESWL
Anesthesia: Moderate Sedation | Laterality: Left

## 2021-03-26 ENCOUNTER — Other Ambulatory Visit: Payer: Self-pay | Admitting: Urology

## 2021-03-26 DIAGNOSIS — C61 Malignant neoplasm of prostate: Secondary | ICD-10-CM

## 2021-04-07 ENCOUNTER — Ambulatory Visit: Payer: Managed Care, Other (non HMO)

## 2021-04-08 ENCOUNTER — Other Ambulatory Visit: Payer: Self-pay

## 2021-04-08 ENCOUNTER — Ambulatory Visit
Admission: RE | Admit: 2021-04-08 | Discharge: 2021-04-08 | Disposition: A | Discharge status: Home / Self Care | Payer: Managed Care, Other (non HMO) | Source: Ambulatory Visit | Attending: Urology | Admitting: Urology

## 2021-04-08 DIAGNOSIS — C61 Malignant neoplasm of prostate: Secondary | ICD-10-CM | POA: Diagnosis not present

## 2021-04-08 IMAGING — MR MR PROSTATE WO/W CM
56 series · 56 of 56 positions shown · IV contrast (8ml Gadavist)
Comparison: None
COMPARISON: None

Addendum:
CLINICAL DATA: Elevated PSA.  History of prostate cancer.

EXAM:
MR PROSTATE WITHOUT AND WITH CONTRAST
TECHNIQUE: Multiplanar multisequence MRI images were obtained of the pelvis
centered about the prostate. Pre and post contrast images were
obtained.
CONTRAST:  8mL GADAVIST GADOBUTROL 1 MMOL/ML IV SOLN

[Series 3: ax in&out whole · axial · 6.0mm · 0.74mm/px · 1 of 35 slices shown (1 of 2)]
[im 1/35]
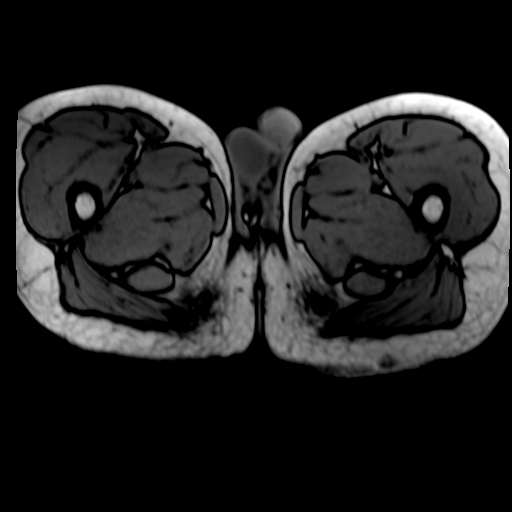

[Series 3: ax in&out whole · axial · 6.0mm · 0.74mm/px · 1 of 35 slices shown (2 of 2)]
[im 1/35]
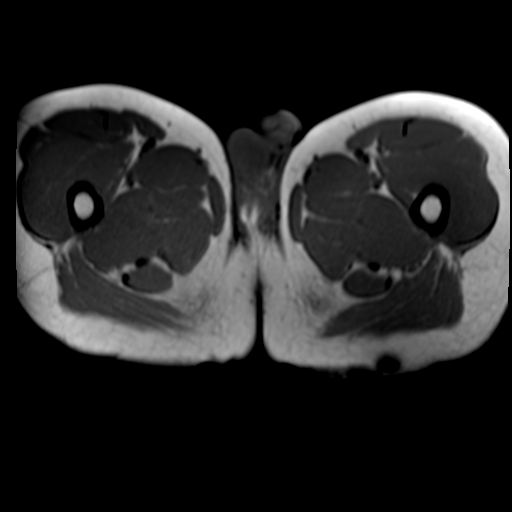

[Series 4: T2 · axial · 3.0mm · 0.56mm/px · 1 of 27 slices shown (1 of 3)]
[im 1/27]
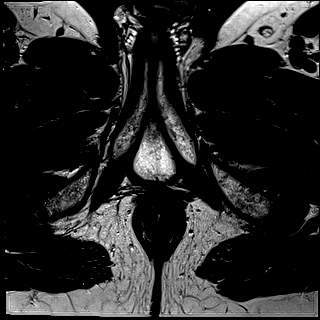

[Series 5: T2 · coronal · 3.0mm · 0.70mm/px · 1 of 35 slices shown (2 of 3)]
[im 1/35]
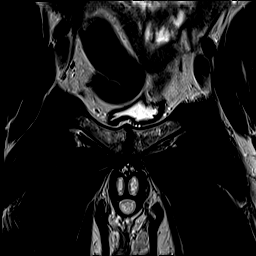

[Series 6: T2 · axial · 1.0mm · 1.04mm/px · 1 of 80 slices shown (3 of 3)]
[im 1/80]
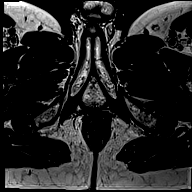

[Series 7: DWI · axial · 3.0mm · 0.86mm/px · 1 of 81 slices shown (1 of 3)]
[im 1/81]
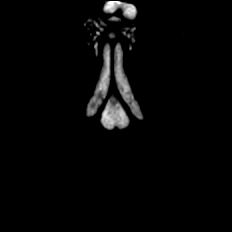

[Series 8: DWI · axial · 3.0mm · 0.86mm/px · 1 of 27 slices shown (2 of 3)]
[im 1/27]
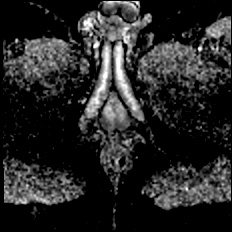

[Series 9: DWI · axial · 3.0mm · 0.86mm/px · 1 of 27 slices shown (3 of 3)]
[im 1/27]
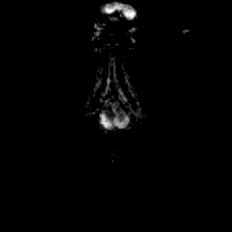

[Series 10: T1 · axial · 3.0mm · 1.15mm/px · 1 of 28 slices shown (1 of 48)]
[im 1/28]
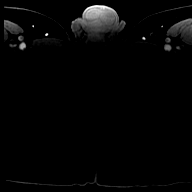

[Series 11: T1 · axial · 3.0mm · 1.15mm/px · 1 of 28 slices shown (2 of 48)]
[im 1/28]
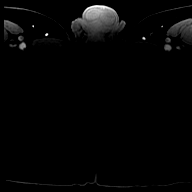

[Series 12: T1 · axial · 3.0mm · 1.15mm/px · 1 of 27 slices shown (3 of 48)]
[im 1/27]
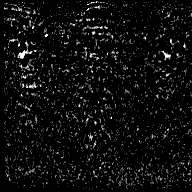

[Series 13: T1 · axial · 3.0mm · 1.15mm/px · 1 of 28 slices shown (4 of 48)]
[im 1/28]
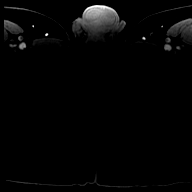

[Series 14: T1 · axial · 3.0mm · 1.15mm/px · 1 of 28 slices shown (5 of 48)]
[im 1/28]
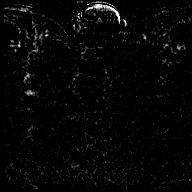

[Series 15: T1 · axial · 3.0mm · 1.15mm/px · 1 of 28 slices shown (6 of 48)]
[im 1/28]
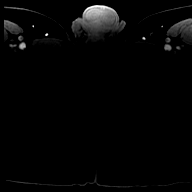

[Series 16: T1 · axial · 3.0mm · 1.15mm/px · 1 of 28 slices shown (7 of 48)]
[im 1/28]
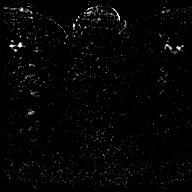

[Series 17: T1 · axial · 3.0mm · 1.15mm/px · 1 of 28 slices shown (8 of 48)]
[im 1/28]
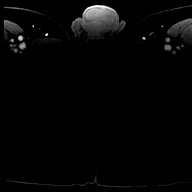

[Series 18: T1 · axial · 3.0mm · 1.15mm/px · 1 of 28 slices shown (9 of 48)]
[im 1/28]
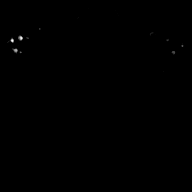

[Series 19: T1 · axial · 3.0mm · 1.15mm/px · 1 of 28 slices shown (10 of 48)]
[im 1/28]
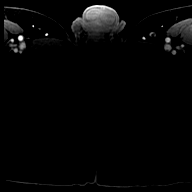

[Series 20: T1 · axial · 3.0mm · 1.15mm/px · 1 of 28 slices shown (11 of 48)]
[im 1/28]
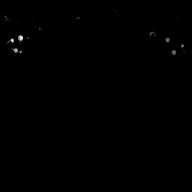

[Series 21: T1 · axial · 3.0mm · 1.15mm/px · 1 of 28 slices shown (12 of 48)]
[im 1/28]
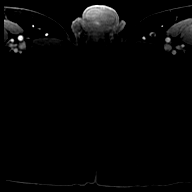

[Series 22: T1 · axial · 3.0mm · 1.15mm/px · 1 of 28 slices shown (13 of 48)]
[im 1/28]
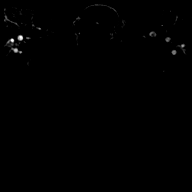

[Series 23: T1 · axial · 3.0mm · 1.15mm/px · 1 of 28 slices shown (14 of 48)]
[im 1/28]
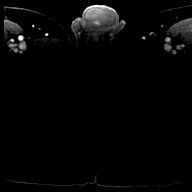

[Series 24: T1 · axial · 3.0mm · 1.15mm/px · 1 of 28 slices shown (15 of 48)]
[im 1/28]
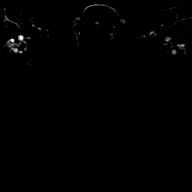

[Series 25: T1 · axial · 3.0mm · 1.15mm/px · 1 of 28 slices shown (16 of 48)]
[im 1/28]
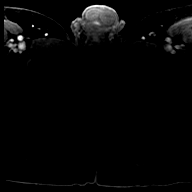

[Series 26: T1 · axial · 3.0mm · 1.15mm/px · 1 of 28 slices shown (17 of 48)]
[im 1/28]
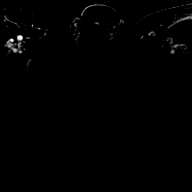

[Series 27: T1 · axial · 3.0mm · 1.15mm/px · 1 of 28 slices shown (18 of 48)]
[im 1/28]
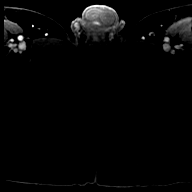

[Series 28: T1 · axial · 3.0mm · 1.15mm/px · 1 of 28 slices shown (19 of 48)]
[im 1/28]
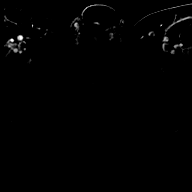

[Series 29: T1 · axial · 3.0mm · 1.15mm/px · 1 of 28 slices shown (20 of 48)]
[im 1/28]
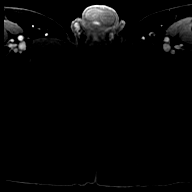

[Series 30: T1 · axial · 3.0mm · 1.15mm/px · 1 of 28 slices shown (21 of 48)]
[im 1/28]
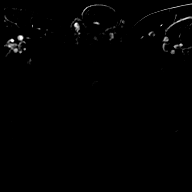

[Series 31: T1 · axial · 3.0mm · 1.15mm/px · 1 of 28 slices shown (22 of 48)]
[im 1/28]
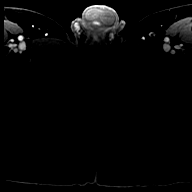

[Series 32: T1 · axial · 3.0mm · 1.15mm/px · 1 of 28 slices shown (23 of 48)]
[im 1/28]
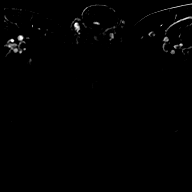

[Series 33: T1 · axial · 3.0mm · 1.15mm/px · 1 of 28 slices shown (24 of 48)]
[im 1/28]
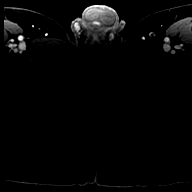

[Series 34: T1 · axial · 3.0mm · 1.15mm/px · 1 of 28 slices shown (25 of 48)]
[im 1/28]
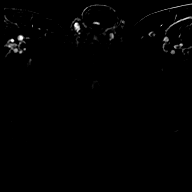

[Series 35: T1 · axial · 3.0mm · 1.15mm/px · 1 of 28 slices shown (26 of 48)]
[im 1/28]
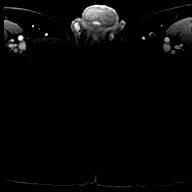

[Series 36: T1 · axial · 3.0mm · 1.15mm/px · 1 of 28 slices shown (27 of 48)]
[im 1/28]
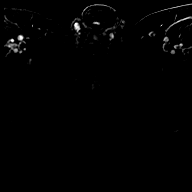

[Series 37: T1 · axial · 3.0mm · 1.15mm/px · 1 of 28 slices shown (28 of 48)]
[im 1/28]
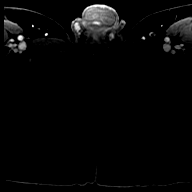

[Series 38: T1 · axial · 3.0mm · 1.15mm/px · 1 of 28 slices shown (29 of 48)]
[im 1/28]
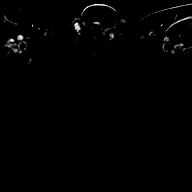

[Series 39: T1 · axial · 3.0mm · 1.15mm/px · 1 of 28 slices shown (30 of 48)]
[im 1/28]
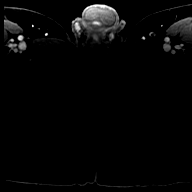

[Series 40: T1 · axial · 3.0mm · 1.15mm/px · 1 of 28 slices shown (31 of 48)]
[im 1/28]
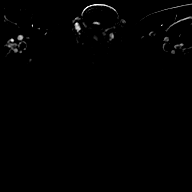

[Series 41: T1 · axial · 3.0mm · 1.15mm/px · 1 of 28 slices shown (32 of 48)]
[im 1/28]
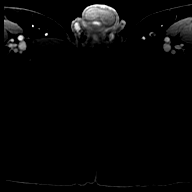

[Series 42: T1 · axial · 3.0mm · 1.15mm/px · 1 of 28 slices shown (33 of 48)]
[im 1/28]
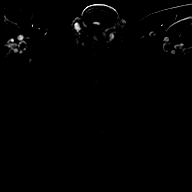

[Series 43: T1 · axial · 3.0mm · 1.15mm/px · 1 of 28 slices shown (34 of 48)]
[im 1/28]
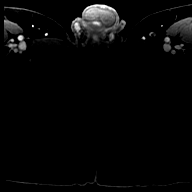

[Series 44: T1 · axial · 3.0mm · 1.15mm/px · 1 of 28 slices shown (35 of 48)]
[im 1/28]
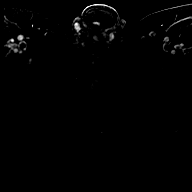

[Series 45: T1 · axial · 3.0mm · 1.15mm/px · 1 of 28 slices shown (36 of 48)]
[im 1/28]
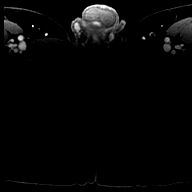

[Series 46: T1 · axial · 3.0mm · 1.15mm/px · 1 of 28 slices shown (37 of 48)]
[im 1/28]
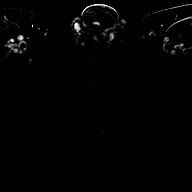

[Series 47: T1 · axial · 3.0mm · 1.15mm/px · 1 of 28 slices shown (38 of 48)]
[im 1/28]
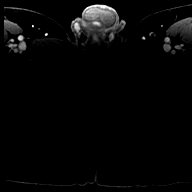

[Series 48: T1 · axial · 3.0mm · 1.15mm/px · 1 of 28 slices shown (39 of 48)]
[im 1/28]
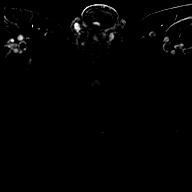

[Series 49: T1 · axial · 3.0mm · 1.15mm/px · 1 of 28 slices shown (40 of 48)]
[im 1/28]
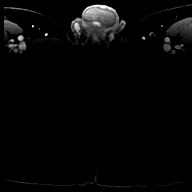

[Series 50: T1 · axial · 3.0mm · 1.15mm/px · 1 of 28 slices shown (41 of 48)]
[im 1/28]
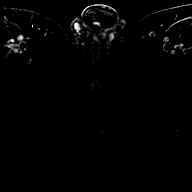

[Series 51: T1 · axial · 3.0mm · 1.15mm/px · 1 of 28 slices shown (42 of 48)]
[im 1/28]
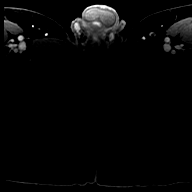

[Series 52: T1 · axial · 3.0mm · 1.15mm/px · 1 of 28 slices shown (43 of 48)]
[im 1/28]
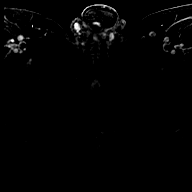

[Series 53: T1 · axial · 3.0mm · 1.15mm/px · 1 of 28 slices shown (44 of 48)]
[im 1/28]
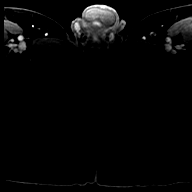

[Series 54: T1 · axial · 3.0mm · 1.15mm/px · 1 of 28 slices shown (45 of 48)]
[im 1/28]
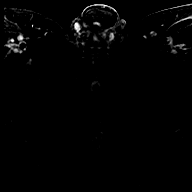

[Series 55: T1 · axial · 3.0mm · 1.15mm/px · 1 of 28 slices shown (46 of 48)]
[im 1/28]
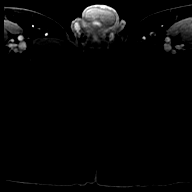

[Series 56: T1 · axial · 3.0mm · 1.15mm/px · 1 of 28 slices shown (47 of 48)]
[im 1/28]
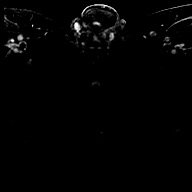

[Series 57: T1 · axial · 3.0mm · 1.15mm/px · 1 of 28 slices shown (48 of 48)]
[im 1/28]
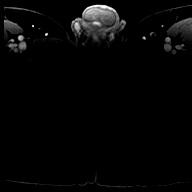

[56 of 56 positions shown; findings below may reference images not displayed]

FINDINGS: Prostate:

Transitional zone: No high-risk lesion in the transitional zone with
mild BPH.

Peripheral zone: Diffuse linear and wedge-shaped areas of T2
hypointensity throughout the peripheral zone without focal area of
diffusion related abnormality to suggest high-risk lesion.

Volume: 4.7 x 3.8 x 4.5 (volume = 42 cc) cm

Transcapsular spread:  Absent

Seminal vesicle involvement: Absent

Neurovascular bundle involvement: Absent

Pelvic adenopathy: Absent

Bone metastasis: Absent

Other findings: None
IMPRESSION: No evidence of high-risk lesion. Overall assessment PIRADS category
2 with diffuse low signal throughout the peripheral zone with
appearance compatible with prior prostatitis and with changes of
mild BPH.

ADDENDUM:
Additional sequences were made available. Reviewing initial
sequences does show an area of T2 hypointensity in the RIGHT
prostate at the extreme apex that shows mild dark appearance on ADC
slightly less than other areas the RIGHT prostate and mild bulging
of the contour. (Image 54/6) 1.0 x 0.8 cm

Very subtle variable enhancement in this area. Not to the extent
that would upgrade this lesion from a PIRADS 3 to a PIRADS 4.
Overall assessment PIRADS category 3 for this area. No gross
extracapsular extension though bulging is suspicious, potentially
due to some volume loss in the contralateral LEFT gland at the apex.

Calculated volume based on DynaCAD 38.8 cc.

Final impression: PIRADS category 3 lesion at the RIGHT prostate
apex seen on a background of prostatitis with some bulging of the
capsule.

These results will be called to the ordering clinician or
representative by the Radiologist Assistant, and communication
documented in the PACS or [REDACTED].

*** End of Addendum ***
FINDINGS: Prostate:

Transitional zone: No high-risk lesion in the transitional zone with
mild BPH.

Peripheral zone: Diffuse linear and wedge-shaped areas of T2
hypointensity throughout the peripheral zone without focal area of
diffusion related abnormality to suggest high-risk lesion.

Volume: 4.7 x 3.8 x 4.5 (volume = 42 cc) cm

Transcapsular spread:  Absent

Seminal vesicle involvement: Absent

Neurovascular bundle involvement: Absent

Pelvic adenopathy: Absent

Bone metastasis: Absent

Other findings: None
IMPRESSION: No evidence of high-risk lesion. Overall assessment PIRADS category
2 with diffuse low signal throughout the peripheral zone with
appearance compatible with prior prostatitis and with changes of
mild BPH.

## 2021-04-08 MED ORDER — GADOBUTROL 1 MMOL/ML IV SOLN
8.0000 mL | Freq: Once | INTRAVENOUS | Status: AC | PRN
  Administered 2021-04-08: 8 mL via INTRAVENOUS

## 2021-06-01 ENCOUNTER — Other Ambulatory Visit: Payer: Self-pay | Admitting: Urology

## 2021-06-01 DIAGNOSIS — C61 Malignant neoplasm of prostate: Secondary | ICD-10-CM

## 2022-01-28 ENCOUNTER — Other Ambulatory Visit: Payer: Self-pay | Admitting: Urology

## 2022-01-28 DIAGNOSIS — C61 Malignant neoplasm of prostate: Secondary | ICD-10-CM

## 2022-02-07 ENCOUNTER — Ambulatory Visit
Admission: RE | Admit: 2022-02-07 | Discharge: 2022-02-07 | Disposition: A | Discharge status: Home / Self Care | Payer: Managed Care, Other (non HMO) | Source: Ambulatory Visit | Attending: Urology | Admitting: Urology

## 2022-02-07 DIAGNOSIS — C61 Malignant neoplasm of prostate: Secondary | ICD-10-CM | POA: Diagnosis present

## 2022-02-07 IMAGING — MR MR PROSTATE WO/W CM
56 series · 56 of 56 positions shown · IV contrast (8ml Gadavist)
Comparison: Imaging from [DATE].

CLINICAL DATA: History of elevated PSA, history of prior biopsy. No
reported history of cancer at this time.

EXAM:
MR PROSTATE WITHOUT AND WITH CONTRAST
TECHNIQUE: Multiplanar multisequence MRI images were obtained of the pelvis
centered about the prostate. Pre and post contrast images were
obtained.
CONTRAST:  8mL GADAVIST GADOBUTROL 1 MMOL/ML IV SOLN

[Series 6: ax in&out whole · axial · 3.0mm · 1.19mm/px · 1 of 88 slices shown (1 of 2)]
[im 1/88]
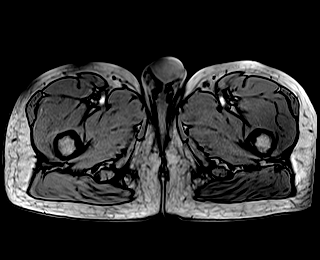

[Series 7: ax in&out whole · axial · 3.0mm · 1.19mm/px · 1 of 88 slices shown (2 of 2)]
[im 1/88]
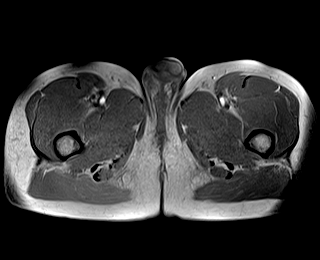

[Series 8: T2 · coronal · 3.0mm · 0.70mm/px · 1 of 35 slices shown (1 of 3)]
[im 1/35]
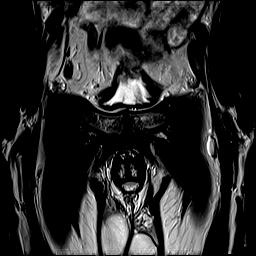

[Series 9: T2 · axial · 3.0mm · 0.56mm/px · 1 of 27 slices shown (2 of 3)]
[im 1/27]
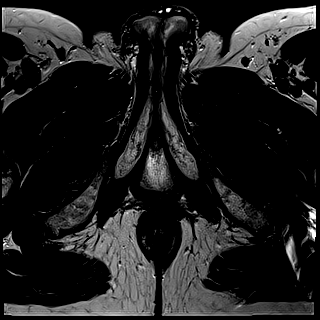

[Series 10: DWI · axial · 3.0mm · 0.86mm/px · 1 of 75 slices shown (1 of 3)]
[im 1/75]
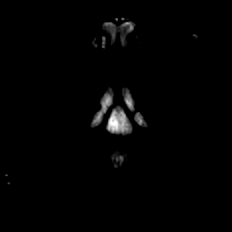

[Series 11: DWI · axial · 3.0mm · 0.86mm/px · 1 of 25 slices shown (2 of 3)]
[im 1/25]
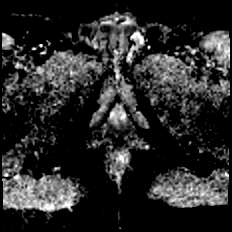

[Series 12: DWI · axial · 3.0mm · 0.86mm/px · 1 of 25 slices shown (3 of 3)]
[im 1/25]
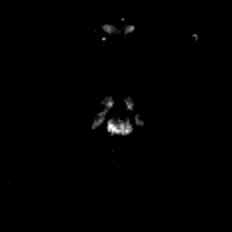

[Series 13: T2 · axial · 1.0mm · 1.04mm/px · 1 of 80 slices shown (3 of 3)]
[im 1/80]
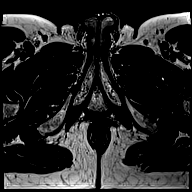

[Series 14: T1 · axial · 3.0mm · 1.15mm/px · 1 of 28 slices shown (1 of 48)]
[im 1/28]
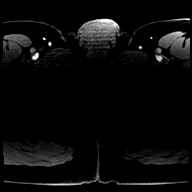

[Series 15: T1 · axial · 3.0mm · 1.15mm/px · 1 of 28 slices shown (2 of 48)]
[im 1/28]
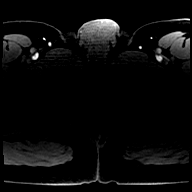

[Series 16: T1 · axial · 3.0mm · 1.15mm/px · 1 of 28 slices shown (3 of 48)]
[im 1/28]
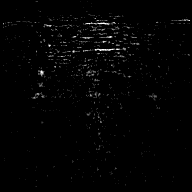

[Series 17: T1 · axial · 3.0mm · 1.15mm/px · 1 of 28 slices shown (4 of 48)]
[im 1/28]
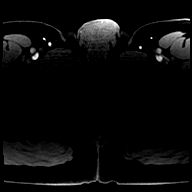

[Series 18: T1 · axial · 3.0mm · 1.15mm/px · 1 of 28 slices shown (5 of 48)]
[im 1/28]
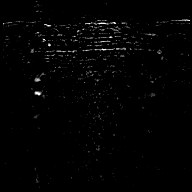

[Series 19: T1 · axial · 3.0mm · 1.15mm/px · 1 of 28 slices shown (6 of 48)]
[im 1/28]
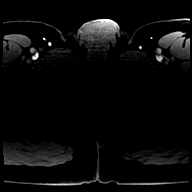

[Series 20: T1 · axial · 3.0mm · 1.15mm/px · 1 of 28 slices shown (7 of 48)]
[im 1/28]
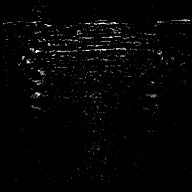

[Series 21: T1 · axial · 3.0mm · 1.15mm/px · 1 of 28 slices shown (8 of 48)]
[im 1/28]
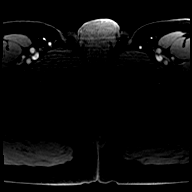

[Series 22: T1 · axial · 3.0mm · 1.15mm/px · 1 of 28 slices shown (9 of 48)]
[im 1/28]
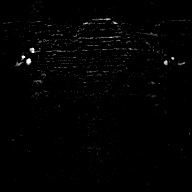

[Series 23: T1 · axial · 3.0mm · 1.15mm/px · 1 of 28 slices shown (10 of 48)]
[im 1/28]
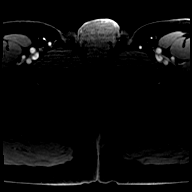

[Series 24: T1 · axial · 3.0mm · 1.15mm/px · 1 of 28 slices shown (11 of 48)]
[im 1/28]
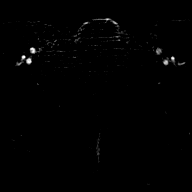

[Series 25: T1 · axial · 3.0mm · 1.15mm/px · 1 of 28 slices shown (12 of 48)]
[im 1/28]
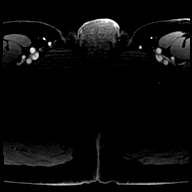

[Series 26: T1 · axial · 3.0mm · 1.15mm/px · 1 of 28 slices shown (13 of 48)]
[im 1/28]
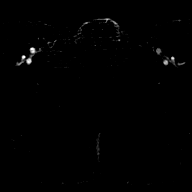

[Series 27: T1 · axial · 3.0mm · 1.15mm/px · 1 of 28 slices shown (14 of 48)]
[im 1/28]
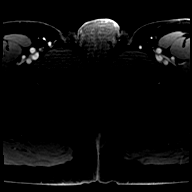

[Series 28: T1 · axial · 3.0mm · 1.15mm/px · 1 of 28 slices shown (15 of 48)]
[im 1/28]
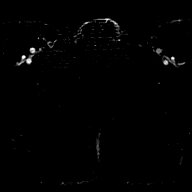

[Series 29: T1 · axial · 3.0mm · 1.15mm/px · 1 of 28 slices shown (16 of 48)]
[im 1/28]
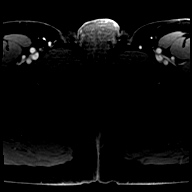

[Series 30: T1 · axial · 3.0mm · 1.15mm/px · 1 of 28 slices shown (17 of 48)]
[im 1/28]
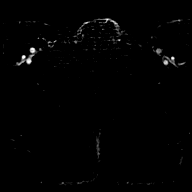

[Series 31: T1 · axial · 3.0mm · 1.15mm/px · 1 of 28 slices shown (18 of 48)]
[im 1/28]
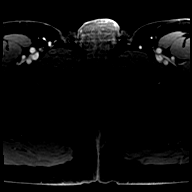

[Series 32: T1 · axial · 3.0mm · 1.15mm/px · 1 of 28 slices shown (19 of 48)]
[im 1/28]
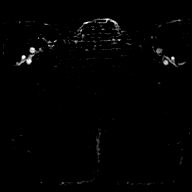

[Series 33: T1 · axial · 3.0mm · 1.15mm/px · 1 of 28 slices shown (20 of 48)]
[im 1/28]
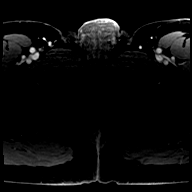

[Series 34: T1 · axial · 3.0mm · 1.15mm/px · 1 of 28 slices shown (21 of 48)]
[im 1/28]
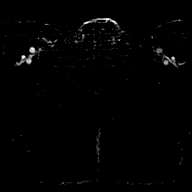

[Series 35: T1 · axial · 3.0mm · 1.15mm/px · 1 of 28 slices shown (22 of 48)]
[im 1/28]
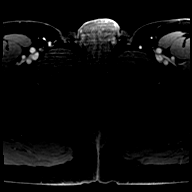

[Series 36: T1 · axial · 3.0mm · 1.15mm/px · 1 of 28 slices shown (23 of 48)]
[im 1/28]
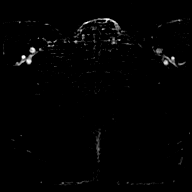

[Series 37: T1 · axial · 3.0mm · 1.15mm/px · 1 of 28 slices shown (24 of 48)]
[im 1/28]
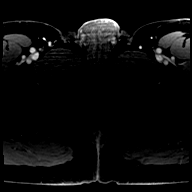

[Series 38: T1 · axial · 3.0mm · 1.15mm/px · 1 of 28 slices shown (25 of 48)]
[im 1/28]
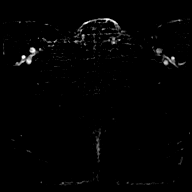

[Series 39: T1 · axial · 3.0mm · 1.15mm/px · 1 of 28 slices shown (26 of 48)]
[im 1/28]
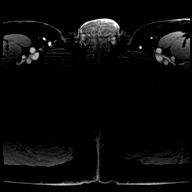

[Series 40: T1 · axial · 3.0mm · 1.15mm/px · 1 of 28 slices shown (27 of 48)]
[im 1/28]
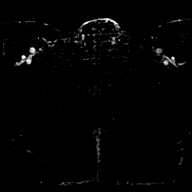

[Series 41: T1 · axial · 3.0mm · 1.15mm/px · 1 of 28 slices shown (28 of 48)]
[im 1/28]
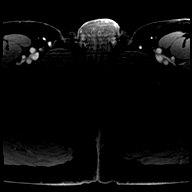

[Series 42: T1 · axial · 3.0mm · 1.15mm/px · 1 of 28 slices shown (29 of 48)]
[im 1/28]
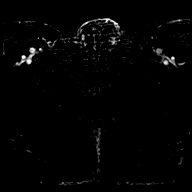

[Series 43: T1 · axial · 3.0mm · 1.15mm/px · 1 of 28 slices shown (30 of 48)]
[im 1/28]
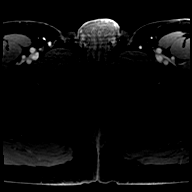

[Series 44: T1 · axial · 3.0mm · 1.15mm/px · 1 of 28 slices shown (31 of 48)]
[im 1/28]
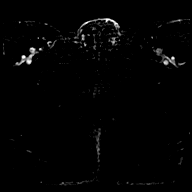

[Series 45: T1 · axial · 3.0mm · 1.15mm/px · 1 of 28 slices shown (32 of 48)]
[im 1/28]
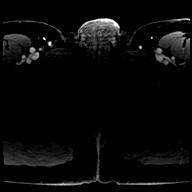

[Series 46: T1 · axial · 3.0mm · 1.15mm/px · 1 of 28 slices shown (33 of 48)]
[im 1/28]
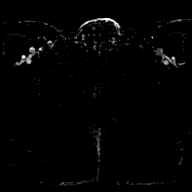

[Series 47: T1 · axial · 3.0mm · 1.15mm/px · 1 of 28 slices shown (34 of 48)]
[im 1/28]
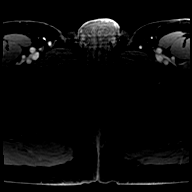

[Series 48: T1 · axial · 3.0mm · 1.15mm/px · 1 of 28 slices shown (35 of 48)]
[im 1/28]
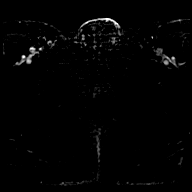

[Series 49: T1 · axial · 3.0mm · 1.15mm/px · 1 of 28 slices shown (36 of 48)]
[im 1/28]
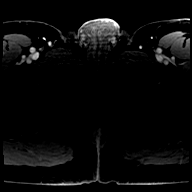

[Series 50: T1 · axial · 3.0mm · 1.15mm/px · 1 of 28 slices shown (37 of 48)]
[im 1/28]
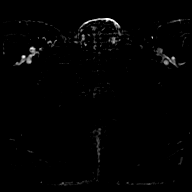

[Series 51: T1 · axial · 3.0mm · 1.15mm/px · 1 of 28 slices shown (38 of 48)]
[im 1/28]
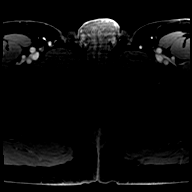

[Series 52: T1 · axial · 3.0mm · 1.15mm/px · 1 of 28 slices shown (39 of 48)]
[im 1/28]
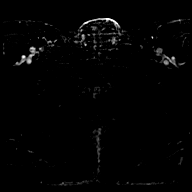

[Series 53: T1 · axial · 3.0mm · 1.15mm/px · 1 of 28 slices shown (40 of 48)]
[im 1/28]
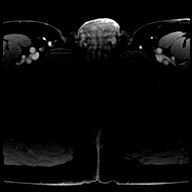

[Series 54: T1 · axial · 3.0mm · 1.15mm/px · 1 of 28 slices shown (41 of 48)]
[im 1/28]
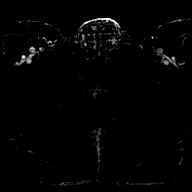

[Series 55: T1 · axial · 3.0mm · 1.15mm/px · 1 of 28 slices shown (42 of 48)]
[im 1/28]
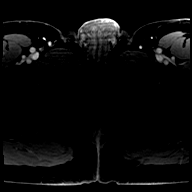

[Series 56: T1 · axial · 3.0mm · 1.15mm/px · 1 of 28 slices shown (43 of 48)]
[im 1/28]
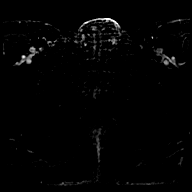

[Series 57: T1 · axial · 3.0mm · 1.15mm/px · 1 of 28 slices shown (44 of 48)]
[im 1/28]
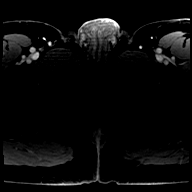

[Series 58: T1 · axial · 3.0mm · 1.15mm/px · 1 of 28 slices shown (45 of 48)]
[im 1/28]
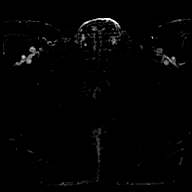

[Series 59: T1 · axial · 3.0mm · 1.15mm/px · 1 of 28 slices shown (46 of 48)]
[im 1/28]
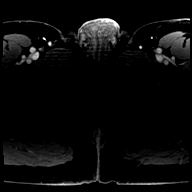

[Series 60: T1 · axial · 3.0mm · 1.15mm/px · 1 of 27 slices shown (47 of 48)]
[im 1/27]
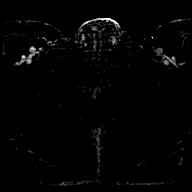

[Series 61: T1 · axial · 3.0mm · 1.15mm/px · 1 of 28 slices shown (48 of 48)]
[im 1/28]
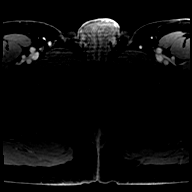

[56 of 56 positions shown; findings below may reference images not displayed]

FINDINGS: Prostate:

Peripheral zone: Linear and wedge-shaped areas of T2 hypointensity
throughout the peripheral zone. Less bulging at the RIGHT prostate
apex with low signal in this area that is less focal in appearance.
Generalized low signal throughout the peripheral zone.w area in the
RIGHT anterior paramidline prostate in the mid gland the junction of
peripheral and transitional zone displays hypointense features on T2
with indistinct margins and mild heterogeneity, equivocal diffusion
and positive enhancement. PIRADS category 4 (image [DATE] and image
14/29)

Transitional zone: Mild BPH changes of the transitional zone without
sign of high-risk lesion.

Volume: 37.1 cc

Transcapsular spread:  Absent

Seminal vesicle involvement: Absent

Neurovascular bundle involvement: Absent

Pelvic adenopathy: Absent

Bone metastasis: Absent

Other findings: None
IMPRESSION: 1. PIRADS category 4 lesion in the RIGHT anterior paramidline
prostate in the mid gland.
2. No evidence of extracapsular extension, seminal vesicle invasion,
or metastatic disease in the pelvis.

## 2022-02-07 MED ORDER — GADOBUTROL 1 MMOL/ML IV SOLN
8.0000 mL | Freq: Once | INTRAVENOUS | Status: AC | PRN
  Administered 2022-02-07: 8 mL via INTRAVENOUS

## 2022-03-22 ENCOUNTER — Other Ambulatory Visit: Payer: Managed Care, Other (non HMO)

## 2022-03-31 ENCOUNTER — Ambulatory Visit: Admission: RE | Admit: 2022-03-31 | Payer: Managed Care, Other (non HMO) | Source: Home / Self Care | Admitting: Urology

## 2022-03-31 ENCOUNTER — Encounter: Admission: RE | Payer: Self-pay | Source: Home / Self Care

## 2022-03-31 SURGERY — BIOPSY, PROSTATE
Anesthesia: Choice

## 2023-02-16 ENCOUNTER — Other Ambulatory Visit: Payer: Self-pay | Admitting: Urology

## 2023-02-16 DIAGNOSIS — C61 Malignant neoplasm of prostate: Secondary | ICD-10-CM

## 2023-04-04 ENCOUNTER — Other Ambulatory Visit: Payer: Managed Care, Other (non HMO)

## 2023-08-17 ENCOUNTER — Other Ambulatory Visit: Payer: Self-pay | Admitting: Urology

## 2023-08-17 DIAGNOSIS — R972 Elevated prostate specific antigen [PSA]: Secondary | ICD-10-CM

## 2023-08-23 ENCOUNTER — Encounter: Payer: Self-pay | Admitting: Gastroenterology

## 2023-09-28 ENCOUNTER — Encounter: Payer: Self-pay | Admitting: Urology

## 2023-10-12 ENCOUNTER — Ambulatory Visit
Admission: RE | Admit: 2023-10-12 | Discharge: 2023-10-12 | Disposition: A | Discharge status: Home / Self Care | Payer: Managed Care, Other (non HMO) | Source: Ambulatory Visit | Attending: Urology | Admitting: Urology

## 2023-10-12 DIAGNOSIS — R972 Elevated prostate specific antigen [PSA]: Secondary | ICD-10-CM

## 2023-10-12 MED ORDER — GADOPICLENOL 0.5 MMOL/ML IV SOLN
7.5000 mL | Freq: Once | INTRAVENOUS | Status: AC | PRN
  Administered 2023-10-12: 7.5 mL via INTRAVENOUS

## 2023-10-26 ENCOUNTER — Encounter: Payer: Self-pay | Admitting: Pediatrics

## 2023-11-15 ENCOUNTER — Ambulatory Visit (AMBULATORY_SURGERY_CENTER): Payer: Managed Care, Other (non HMO)

## 2023-11-15 VITALS — Ht 71.0 in | Wt 177.0 lb

## 2023-11-15 DIAGNOSIS — Z1211 Encounter for screening for malignant neoplasm of colon: Secondary | ICD-10-CM

## 2023-11-15 MED ORDER — SUFLAVE 178.7 G PO SOLR: 1.0000 | ORAL | 0 refills | Status: DC

## 2023-11-15 NOTE — Progress Notes (Signed)

## 2023-11-22 ENCOUNTER — Encounter: Payer: Self-pay | Admitting: Pediatrics

## 2023-11-28 NOTE — Progress Notes (Unsigned)
[facility] History and Physical   Primary Care Physician:  Tisovec, Adelfa Koh, MD   Reason for Procedure:  Screening colonoscopy  Plan:    Screening colonoscopy  HPI: Paul Wiley is a 53 y.o. male undergoing screening colonoscopy.  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 Authorizing Provider  atorvastatin (LIPITOR) 20 MG tablet Take 20 mg by mouth daily.      [provider]  PEG 3350-KCl-NaCl-NaSulf-MgSul (SUFLAVE) 178.7 g SOLR Take 1 kit by mouth as directed. 11/15/23   Ottie Glazier, MD    Current Outpatient Medications  Medication Sig Dispense Refill   atorvastatin (LIPITOR) 20 MG tablet Take 20 mg by mouth daily.       PEG 3350-KCl-NaCl-NaSulf-MgSul (SUFLAVE) 178.7 g SOLR Take 1 kit by mouth as directed. 1 each 0   No current facility-administered medications for this visit.    Allergies as of 11/30/2023   (No Known Allergies)    Family History  Problem Relation Age of Onset   Colon polyps Father    Hyperlipidemia Father    Pancreatic cancer Maternal Aunt    Hyperlipidemia Maternal Grandmother    Hypertension Maternal Grandmother    Prostate cancer Paternal Grandfather    Colon cancer Neg Hx    Rectal cancer Neg Hx    Stomach cancer Neg Hx     Social History   Socioeconomic History   Marital status: Married    Spouse name: Not on file   Number of children: 0   Years of education: Not on file   Highest education level: Not on file  Occupational History   Occupation: Programme researcher, broadcasting/film/video: [employer]  Tobacco Use   Smoking status: Never    Smokeless tobacco: Never  Substance and Sexual Activity   Alcohol use: Yes    Comment: occ beer   Drug use: No   Sexual activity: Not on file  Other Topics Concern   Not on file  Social History Narrative   Not on file   Social Drivers of Health   Financial Resource Strain: Not on file  Food Insecurity: Not on file  Transportation Needs: Not on file  Physical Activity: Not on file  Stress: Not on file  Social Connections: Not on file  Intimate Partner Violence: Not on file    Review of Systems:  All other review of systems negative except as mentioned in the HPI.  Physical Exam: Vital signs There were no vitals taken for this visit.  General:   Alert,  Well-developed, well-nourished, pleasant and cooperative in NAD Airway:  Mallampati  Lungs:  Clear throughout to auscultation.   Heart:  Regular rate and rhythm; no murmurs, clicks, rubs,  or gallops. Abdomen:  Soft, nontender and nondistended. Normal bowel sounds.   Neuro/Psych:  Normal mood and affect. A and O x 3  Maren Beach, MD [facility]

## 2023-11-30 ENCOUNTER — Encounter: Payer: Self-pay | Admitting: Pediatrics

## 2023-11-30 ENCOUNTER — Ambulatory Visit (AMBULATORY_SURGERY_CENTER): Payer: Managed Care, Other (non HMO) | Admitting: Pediatrics

## 2023-11-30 VITALS — BP 123/83 | HR 77 | Temp 98.1°F | Resp 11 | Ht 71.0 in | Wt 177.0 lb

## 2023-11-30 DIAGNOSIS — Z1211 Encounter for screening for malignant neoplasm of colon: Secondary | ICD-10-CM

## 2023-11-30 DIAGNOSIS — K648 Other hemorrhoids: Secondary | ICD-10-CM | POA: Diagnosis not present

## 2023-11-30 DIAGNOSIS — K6289 Other specified diseases of anus and rectum: Secondary | ICD-10-CM | POA: Diagnosis not present

## 2023-11-30 DIAGNOSIS — K5520 Angiodysplasia of colon without hemorrhage: Secondary | ICD-10-CM | POA: Diagnosis not present

## 2023-11-30 MED ORDER — SODIUM CHLORIDE 0.9 % IV SOLN: 500.0000 mL | INTRAVENOUS | Status: DC

## 2023-11-30 NOTE — Op Note (Signed)
[facility] Patient Name: Paul Wiley Procedure Date: 11/30/2023 10:50 AM MRN: [id number] Endoscopist: Maren Beach , MD, [id number] Age: 53 Referring MD:  Date of Birth: 1971/02/21 Gender: Male Account #: [id number] Procedure:                Colonoscopy Indications:              Screening for colorectal malignant neoplasm, Last                            colonoscopy: 2012 Medicines:                Monitored Anesthesia Care Procedure:                Pre-Anesthesia Assessment:                           - Prior to the procedure, a History and Physical                            was performed, and patient medications and                            allergies were reviewed. The patient's tolerance of                            previous anesthesia was also reviewed. The risks                            and benefits of the procedure and the sedation                            options and risks were discussed with the patient.                            All questions were answered, and informed consent                            was obtained. Prior Anticoagulants: The patient has                            taken no anticoagulant or antiplatelet agents. ASA                            Grade Assessment: II - A patient with mild systemic                            disease. After reviewing the risks and benefits,                            the patient was deemed in satisfactory condition to                            undergo the procedure.  After obtaining informed consent, the colonoscope                            was passed under direct vision. Throughout the                            procedure, the patient's blood pressure, pulse, and                            oxygen saturations were monitored continuously. The                            CF [id number] #[id number] was introduced through the anus                            and advanced to the cecum, identified by                             appendiceal orifice and ileocecal valve. The                            colonoscopy was performed without difficulty. The                            patient tolerated the procedure well. The quality                            of the bowel preparation was excellent. The                            terminal ileum, the ileocecal valve, the                            appendiceal orifice and the rectum were                            photographed. Scope In: 10:55:41 AM Scope Out: 11:08:34 AM Scope Withdrawal Time: 0 hours 8 minutes 27 seconds  Total Procedure Duration: 0 hours 12 minutes 53 seconds  Findings:                 The perianal and digital rectal examinations were                            normal. Pertinent negatives include normal                            sphincter tone and no palpable rectal lesions.                           The colon (entire examined portion) appeared normal.                           Two angioectasias were found in the rectum and in  the cecum.                           Internal hemorrhoids were found during retroflexion.                           Anal papilla(e) were hypertrophied. Complications:            No immediate complications. Estimated blood loss:                            None. Estimated Blood Loss:     Estimated blood loss: none. Impression:               - The entire examined colon is normal.                           - Two colonic angioectasias.                           - Internal hemorrhoids.                           - Anal papilla(e) were hypertrophied.                           - No specimens collected. Recommendation:           - Discharge patient to home (ambulatory).                           - Repeat colonoscopy in 10 years for screening                            purposes.                           - The findings and recommendations were discussed                            with  the patient.                           - Return to referring physician.                           - Patient has a contact number available for                            emergencies. The signs and symptoms of potential                            delayed complications were discussed with the                            patient. Return to normal activities tomorrow.                            Written discharge  instructions were provided to the                            patient. Maren Beach, MD 11/30/2023 11:13:20 AM This report has been signed electronically.

## 2023-11-30 NOTE — Progress Notes (Signed)
Report to PACU, RN, vss, BBS= Clear.

## 2023-11-30 NOTE — Patient Instructions (Signed)

## 2023-11-30 NOTE — Progress Notes (Signed)
Pt's states no medical or surgical changes since previsit or office visit.

## 2023-12-01 ENCOUNTER — Telehealth: Payer: Self-pay

## 2023-12-01 NOTE — Telephone Encounter (Signed)
Left message on follow up call.

## 2024-08-05 ENCOUNTER — Other Ambulatory Visit: Payer: Self-pay | Admitting: Nurse Practitioner

## 2024-08-05 DIAGNOSIS — C61 Malignant neoplasm of prostate: Secondary | ICD-10-CM

## 2024-08-06 ENCOUNTER — Encounter: Payer: Self-pay | Admitting: Nurse Practitioner

## 2024-09-26 ENCOUNTER — Ambulatory Visit
Admission: RE | Admit: 2024-09-26 | Discharge: 2024-09-26 | Disposition: A | Discharge status: Home / Self Care | Source: Ambulatory Visit | Attending: Nurse Practitioner | Admitting: Nurse Practitioner

## 2024-09-26 DIAGNOSIS — C61 Malignant neoplasm of prostate: Secondary | ICD-10-CM

## 2024-09-26 MED ORDER — GADOPICLENOL 0.5 MMOL/ML IV SOLN
8.0000 mL | Freq: Once | INTRAVENOUS | Status: AC | PRN
  Administered 2024-09-26: 8 mL via INTRAVENOUS

## 2024-10-24 ENCOUNTER — Ambulatory Visit: Admitting: Podiatry

## 2024-11-28 ENCOUNTER — Institutional Professional Consult (permissible substitution): Admitting: Neurology
